# Patient Record
Sex: Female | Born: 1990 | Race: White | Hispanic: No | Marital: Single | Smoking: Current every day smoker
Health system: Southern US, Community
[De-identification: ages and names within clinical notes are randomized; demographics above are authoritative.]

## PROBLEM LIST (undated history)

## (undated) DIAGNOSIS — Z789 Other specified health status: Secondary | ICD-10-CM

## (undated) HISTORY — PX: TONSILLECTOMY: SUR1361

## (undated) HISTORY — PX: OTHER SURGICAL HISTORY: SHX169

---
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
## 2012-06-12 ENCOUNTER — Encounter (HOSPITAL_COMMUNITY): Payer: Self-pay | Admitting: Emergency Medicine

## 2012-06-12 ENCOUNTER — Emergency Department (HOSPITAL_COMMUNITY)
Admission: EM | Admit: 2012-06-12 | Discharge: 2012-06-12 | Disposition: A | Discharge status: Home / Self Care | Payer: Self-pay | Attending: Emergency Medicine | Admitting: Emergency Medicine

## 2012-06-12 DIAGNOSIS — R05 Cough: Secondary | ICD-10-CM | POA: Insufficient documentation

## 2012-06-12 DIAGNOSIS — R5381 Other malaise: Secondary | ICD-10-CM | POA: Insufficient documentation

## 2012-06-12 DIAGNOSIS — R6883 Chills (without fever): Secondary | ICD-10-CM | POA: Insufficient documentation

## 2012-06-12 DIAGNOSIS — J069 Acute upper respiratory infection, unspecified: Secondary | ICD-10-CM | POA: Insufficient documentation

## 2012-06-12 DIAGNOSIS — IMO0001 Reserved for inherently not codable concepts without codable children: Secondary | ICD-10-CM | POA: Insufficient documentation

## 2012-06-12 DIAGNOSIS — J3489 Other specified disorders of nose and nasal sinuses: Secondary | ICD-10-CM | POA: Insufficient documentation

## 2012-06-12 DIAGNOSIS — R059 Cough, unspecified: Secondary | ICD-10-CM | POA: Insufficient documentation

## 2012-06-12 DIAGNOSIS — F17200 Nicotine dependence, unspecified, uncomplicated: Secondary | ICD-10-CM | POA: Insufficient documentation

## 2012-06-12 DIAGNOSIS — J029 Acute pharyngitis, unspecified: Secondary | ICD-10-CM | POA: Insufficient documentation

## 2012-06-12 MED ORDER — PREDNISONE 10 MG PO TABS: 10.0000 mg | ORAL_TABLET | Freq: Every day | ORAL | Status: DC

## 2012-06-12 MED ORDER — AZITHROMYCIN 250 MG PO TABS: ORAL_TABLET | ORAL | Status: DC

## 2012-06-12 MED ORDER — GUAIFENESIN-CODEINE 100-10 MG/5ML PO SYRP: 10.0000 mL | ORAL_SOLUTION | Freq: Three times a day (TID) | ORAL | Status: DC | PRN

## 2012-06-12 NOTE — ED Notes (Signed)
Pt c/o cough/congestion/not feeling well x 3 days. Denies v/d. Nad noted.

## 2012-06-15 NOTE — ED Provider Notes (Signed)
History     CSN: [id number]  Arrival date & time 06/12/12  1428   First MD Initiated Contact with Patient 06/12/12 1532      Chief Complaint  Patient presents with  . Cough  . Nasal Congestion    (Consider location/radiation/quality/duration/timing/severity/associated sxs/prior treatment) HPI Comments: Shelby Ferguson is a 22 y.o. female who presents to the Emergency Department complaining of nasal congestion, cough and generalized malaise for 3 days.  Cough is occasionally productive of yellow sputum.  He denies neck pain or stiffness, fever, vomiting, chest pain or shortness of breath.  States his girlfriend has similar symptoms.    Patient is a 22 y.o. female presenting with cough. The history is provided by the patient.  Cough Cough characteristics: occassionally productive. Severity:  Mild Onset quality:  Gradual Duration:  3 days Timing:  Intermittent Progression:  Unchanged Chronicity:  New Smoker: yes   Context: upper respiratory infection   Relieved by:  Nothing Worsened by:  Deep breathing Ineffective treatments: benadryl and mucinex. Associated symptoms: chills, myalgias, rhinorrhea and sore throat   Associated symptoms: no chest pain, no ear fullness, no ear pain, no fever, no headaches, no rash, no shortness of breath, no sinus congestion and no wheezing     History reviewed. No pertinent past medical history.  History reviewed. No pertinent past surgical history.  No family history on file.  History  Substance Use Topics  . Smoking status: Current Every Day Smoker -- 0.50 packs/day    Types: Cigarettes  . Smokeless tobacco: Not on file  . Alcohol Use: No    OB History   Grav Para Term Preterm Abortions TAB SAB Ect Mult Living                  Review of Systems  Constitutional: Positive for chills. Negative for fever, activity change and appetite change.  HENT: Positive for congestion, sore throat and rhinorrhea. Negative for ear pain, facial  swelling, trouble swallowing, neck pain and neck stiffness.   Eyes: Negative for visual disturbance.  Respiratory: Positive for cough. Negative for chest tightness, shortness of breath, wheezing and stridor.   Cardiovascular: Negative for chest pain.  Gastrointestinal: Negative for nausea, vomiting and abdominal pain.  Musculoskeletal: Positive for myalgias.  Skin: Negative.  Negative for rash.  Neurological: Negative for dizziness, weakness, numbness and headaches.  Hematological: Negative for adenopathy.  Psychiatric/Behavioral: Negative for confusion.  All other systems reviewed and are negative.    Allergies  Review of patient's allergies indicates no known allergies.  Home Medications   Current Outpatient Rx  Name  Route  Sig  Dispense  Refill  . diphenhydrAMINE (BENADRYL) 25 mg capsule   Oral   Take 25 mg by mouth every 6 (six) hours as needed for allergies.         Marland Kitchen guaiFENesin (MUCINEX) 600 MG 12 hr tablet   Oral   Take 1,200 mg by mouth 2 (two) times daily.         Marland Kitchen azithromycin (ZITHROMAX Z-PAK) 250 MG tablet      Take two tablets on day one, then one tab qd days 2-5   6 tablet   0   . guaiFENesin-codeine (ROBITUSSIN AC) 100-10 MG/5ML syrup   Oral   Take 10 mLs by mouth 3 (three) times daily as needed for cough.   120 mL   0   . predniSONE (DELTASONE) 10 MG tablet   Oral   Take 1 tablet (10 mg total) by  mouth daily.   21 tablet   0     BP 116/81  Pulse 103  Temp(Src) 97.9 F (36.6 C)  Resp 20  Ht 5\' 8"  (1.727 m)  Wt 190 lb (86.183 kg)  BMI 28.9 kg/m2  SpO2 97%  LMP 05/22/2012  Physical Exam  Nursing note and vitals reviewed. Constitutional: She is oriented to person, place, and time. She appears well-developed and well-nourished. No distress.  HENT:  Head: Normocephalic and atraumatic. No trismus in the jaw.  Right Ear: Tympanic membrane and ear canal normal.  Left Ear: Tympanic membrane and ear canal normal.  Nose: Mucosal edema and  rhinorrhea present.  Mouth/Throat: Uvula is midline and mucous membranes are normal. No edematous. Posterior oropharyngeal erythema present. No oropharyngeal exudate, posterior oropharyngeal edema or tonsillar abscesses.  Eyes: Conjunctivae are normal. Pupils are equal, round, and reactive to light. Right eye exhibits no discharge. Left eye exhibits no discharge.  Neck: Normal range of motion and phonation normal. Neck supple. No Brudzinski's sign and no Kernig's sign noted.  Cardiovascular: Normal rate, regular rhythm, normal heart sounds and intact distal pulses.   No murmur heard. Pulmonary/Chest: Effort normal and breath sounds normal. No respiratory distress. She has no wheezes. She has no rales.  Coarse lung sounds bilaterally, no rales or wheezing.   Musculoskeletal: She exhibits no edema.  Lymphadenopathy:    She has no cervical adenopathy.  Neurological: She is alert and oriented to person, place, and time. She exhibits normal muscle tone. Coordination normal.  Skin: Skin is warm and dry.    ED Course  Procedures (including critical care time)  Labs Reviewed - No data to display No results found.   1. Cough   2. URI (upper respiratory infection)       MDM     VSS.  Patient is non-toxic appearing.  Girlfriend is here as patient with similar symptoms. PERC neg Appears stable for discharge.    Agrees to rest, fluids, and close f/u with his PMD or return here if needed    Faizon Capozzi L. Trisha Mangle, PA-C 06/15/12 1422

## 2012-06-17 NOTE — ED Provider Notes (Signed)
Medical screening examination/treatment/procedure(s) were performed by non-physician practitioner and as supervising physician I was immediately available for consultation/collaboration.  Raeford Razor, MD 06/17/12 1535

## 2013-11-24 ENCOUNTER — Encounter (HOSPITAL_COMMUNITY): Payer: Self-pay

## 2013-11-24 ENCOUNTER — Emergency Department (HOSPITAL_COMMUNITY)
Admission: EM | Admit: 2013-11-24 | Discharge: 2013-11-24 | Disposition: A | Discharge status: Home / Self Care | Payer: Self-pay | Attending: Emergency Medicine | Admitting: Emergency Medicine

## 2013-11-24 ENCOUNTER — Emergency Department (HOSPITAL_COMMUNITY): Payer: Self-pay

## 2013-11-24 DIAGNOSIS — J209 Acute bronchitis, unspecified: Secondary | ICD-10-CM

## 2013-11-24 DIAGNOSIS — R059 Cough, unspecified: Secondary | ICD-10-CM

## 2013-11-24 DIAGNOSIS — G479 Sleep disorder, unspecified: Secondary | ICD-10-CM | POA: Insufficient documentation

## 2013-11-24 DIAGNOSIS — R05 Cough: Secondary | ICD-10-CM

## 2013-11-24 DIAGNOSIS — Z720 Tobacco use: Secondary | ICD-10-CM | POA: Insufficient documentation

## 2013-11-24 MED ORDER — HYDROCODONE-ACETAMINOPHEN 5-325 MG PO TABS: 1.0000 | ORAL_TABLET | ORAL | Status: DC | PRN

## 2013-11-24 MED ORDER — ALBUTEROL SULFATE HFA 108 (90 BASE) MCG/ACT IN AERS
2.0000 | INHALATION_SPRAY | Freq: Once | RESPIRATORY_TRACT | Status: AC
Start: 2013-11-24 — End: 2013-11-24
  Administered 2013-11-24: 2 via RESPIRATORY_TRACT
  Filled 2013-11-24: qty 6.7

## 2013-11-24 MED ORDER — IPRATROPIUM-ALBUTEROL 0.5-2.5 (3) MG/3ML IN SOLN
3.0000 mL | Freq: Once | RESPIRATORY_TRACT | Status: AC
  Administered 2013-11-24: 3 mL via RESPIRATORY_TRACT
  Filled 2013-11-24: qty 3

## 2013-11-24 MED ORDER — HYDROCODONE-ACETAMINOPHEN 5-325 MG PO TABS
1.0000 | ORAL_TABLET | Freq: Once | ORAL | Status: AC
  Administered 2013-11-24: 1 via ORAL
  Filled 2013-11-24: qty 1

## 2013-11-24 MED ORDER — AMOXICILLIN 500 MG PO CAPS: 500.0000 mg | ORAL_CAPSULE | Freq: Three times a day (TID) | ORAL | Status: DC

## 2013-11-24 MED ORDER — BENZONATATE 100 MG PO CAPS
200.0000 mg | ORAL_CAPSULE | Freq: Once | ORAL | Status: AC
  Administered 2013-11-24: 200 mg via ORAL
  Filled 2013-11-24: qty 2

## 2013-11-24 MED ORDER — ALBUTEROL SULFATE (2.5 MG/3ML) 0.083% IN NEBU
2.5000 mg | INHALATION_SOLUTION | Freq: Once | RESPIRATORY_TRACT | Status: AC
  Administered 2013-11-24: 2.5 mg via RESPIRATORY_TRACT
  Filled 2013-11-24: qty 3

## 2013-11-24 MED ORDER — ALBUTEROL SULFATE (2.5 MG/3ML) 0.083% IN NEBU
5.0000 mg | INHALATION_SOLUTION | Freq: Once | RESPIRATORY_TRACT | Status: AC
  Administered 2013-11-24: 5 mg via RESPIRATORY_TRACT
  Filled 2013-11-24: qty 6

## 2013-11-24 MED ORDER — BENZONATATE 100 MG PO CAPS: 200.0000 mg | ORAL_CAPSULE | Freq: Three times a day (TID) | ORAL | Status: DC

## 2013-11-24 MED ORDER — PREDNISONE 50 MG PO TABS
60.0000 mg | ORAL_TABLET | Freq: Once | ORAL | Status: AC
  Administered 2013-11-24: 60 mg via ORAL
  Filled 2013-11-24 (×2): qty 1

## 2013-11-24 MED ORDER — PREDNISONE 10 MG PO TABS: ORAL_TABLET | ORAL | Status: DC

## 2013-11-24 NOTE — ED Notes (Signed)
Frequent coughing,

## 2013-11-24 NOTE — ED Provider Notes (Signed)
CSN: [id number]     Arrival date & time 11/24/13  1536 History  This chart was scribed for non-physician practitioner Burgess AmorJulie Yuritzy Zehring, PA-C working with Vanetta MuldersScott Zackowski, MD by Littie Deedsichard Sun, ED Scribe. This patient was seen in room [facility]24[facility] and the patient's care was started at 4:42 PM.      Chief Complaint  Patient presents with  . URI    The history is provided by the patient. No language interpreter was used.   HPI Comments: Shelby Ferguson is a 23 y.o. female who presents to the Emergency Department complaining of gradual onset URI symptoms that began 4-5 days ago. Patient reports having associated productive cough of yellow sputum, difficulty sleeping, rhinorrhea with clear discharge, stopped up ears, wheezing, sore throat and SOB worsened with activity. She has tried alka-seltzer and vitamin C without much relief. She denies fever. 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 Patient is a 0.5 ppd smoker; she has been smoking less since her symptoms started because smoking exacerbates her symptoms. Patient states she normally wheezes when she is sick; she has not used an inhaler in 3 years. Her LNMP was today. She denies hx of asthma.  [facility]; she does not have insurance  History reviewed. No pertinent past medical history. Past Surgical History  Procedure Laterality Date  . Tonsillectomy     No family history on file. History  Substance Use Topics  . Smoking status: Current Every Day Smoker -- 0.50 packs/day    Types: Cigarettes  . Smokeless tobacco: Not on file  . Alcohol Use: No   OB History    No data available     Review of Systems  Constitutional: Negative for fever.  HENT: Positive for congestion, rhinorrhea and sore throat. Negative for sinus pressure, trouble swallowing and voice change.   Eyes: Negative for discharge.  Respiratory: Positive for cough, shortness of breath and wheezing.   Cardiovascular:  Negative for chest pain.  Gastrointestinal: Negative for abdominal pain.  Genitourinary: Negative.   Psychiatric/Behavioral: Positive for sleep disturbance.      Allergies  Review of patient's allergies indicates no known allergies.  Home Medications   Prior to Admission medications   Medication Sig Start Date End Date Taking? Authorizing Provider  amoxicillin (AMOXIL) 500 MG capsule Take 1 capsule (500 mg total) by mouth 3 (three) times daily. 11/24/13   Burgess AmorJulie Amro Winebarger, PA-C  azithromycin (ZITHROMAX Z-PAK) 250 MG tablet Take two tablets on day one, then one tab qd days 2-5 Patient not taking: Reported on 11/24/2013 06/12/12   Tammy L. Triplett, PA-C  benzonatate (TESSALON) 100 MG capsule Take 2 capsules (200 mg total) by mouth every 8 (eight) hours. 11/24/13   Burgess AmorJulie Yutaka Holberg, PA-C  guaiFENesin-codeine (ROBITUSSIN AC) 100-10 MG/5ML syrup Take 10 mLs by mouth 3 (three) times daily as needed for cough. Patient not taking: Reported on 11/24/2013 06/12/12   Tammy L. Triplett, PA-C  HYDROcodone-acetaminophen (NORCO/VICODIN) 5-325 MG per tablet Take 1 tablet by mouth every 4 (four) hours as needed (for cough suppression). 11/24/13   Burgess AmorJulie Anikah Hogge, PA-C  predniSONE (DELTASONE) 10 MG tablet 6, 5, 4, 3, 2 then 1 tablet by mouth daily for 6 days total. 11/24/13   Burgess AmorJulie Martie Fulgham, PA-C   BP 126/79 mmHg  Pulse 81  Temp(Src) 99 F (37.2 C) (Oral)  Resp 18  Ht 5\' 8"  (1.727 m)  Wt 200 lb (90.719 kg)  BMI 30.42 kg/m2  SpO2 96%  LMP 11/17/2013 Physical Exam  Constitutional: She is oriented to person, place, and time. She appears well-developed and well-nourished.  HENT:  Head: Normocephalic and atraumatic.  Right Ear: Tympanic membrane and ear canal normal.  Left Ear: Tympanic membrane and ear canal normal.  Nose: Mucosal edema and rhinorrhea present.  Mouth/Throat: Uvula is midline, oropharynx is clear and moist and mucous membranes are normal. No oropharyngeal exudate, posterior oropharyngeal edema,  posterior oropharyngeal erythema or tonsillar abscesses.  Eyes: Conjunctivae are normal.  Cardiovascular: Normal rate and normal heart sounds.   Pulmonary/Chest: Effort normal. No respiratory distress. She has wheezes. She has no rales.  Intense inspiratory wheeze bilaterally, worse at bases. Milder wheezing with expiration.  Abdominal: Soft. There is no tenderness.  Musculoskeletal: Normal range of motion.  Neurological: She is alert and oriented to person, place, and time.  Skin: Skin is warm and dry. No rash noted.  Psychiatric: She has a normal mood and affect.  Nursing note and vitals reviewed.   ED Course  Procedures  DIAGNOSTIC STUDIES: Oxygen Saturation is 96% on room air, adequate by my interpretation.    COORDINATION OF CARE: 1:44 PM-Discussed treatment plan which includes prednisone, breathing treatment and CXR with pt at bedside and pt agreed to plan.   Labs Review Labs Reviewed - No data to display  Imaging Review Dg Chest 2 View  11/24/2013   CLINICAL DATA:  Cough and sore throat.  EXAM: CHEST  2 VIEW  COMPARISON:  None.  FINDINGS: The heart size and mediastinal contours are within normal limits. Both lungs are clear. The visualized skeletal structures are unremarkable.  IMPRESSION: No active cardiopulmonary disease.   Electronically Signed   By: Maisie Fushomas  Register   On: 11/24/2013 17:03     EKG Interpretation None      Medications  ipratropium-albuterol (DUONEB) 0.5-2.5 (3) MG/3ML nebulizer solution 3 mL (3 mLs Nebulization Given 11/24/13 1706)  albuterol (PROVENTIL) (2.5 MG/3ML) 0.083% nebulizer solution 2.5 mg (2.5 mg Nebulization Given 11/24/13 1706)  predniSONE (DELTASONE) tablet 60 mg (60 mg Oral Given 11/24/13 1655)  albuterol (PROVENTIL) (2.5 MG/3ML) 0.083% nebulizer solution 5 mg (5 mg Nebulization Given 11/24/13 1809)  benzonatate (TESSALON) capsule 200 mg (200 mg Oral Given 11/24/13 1907)  HYDROcodone-acetaminophen (NORCO/VICODIN) 5-325 MG per tablet 1  tablet (1 tablet Oral Given 11/24/13 1907)  albuterol (PROVENTIL HFA;VENTOLIN HFA) 108 (90 BASE) MCG/ACT inhaler 2 puff (2 puffs Inhalation Given 11/24/13 1924)    MDM   Final diagnoses:  Cough  Bronchitis, acute, with bronchospasm    Pt was given albuterol/atrovent neb along with prednisone PO.  She continued to have significant wheezing after this tx, although reports her breathing feels easier.  Repeated albuterol neb x 1.  Wheezing still present but better in left, resolved right lung fields.  She had persistent on productive cough after the second neb so was given tessalon and hydrocodone - coughing became better controlled with this combo of meds.  She was given an albuterol mdi for home use.  Prescribed hydrocodone, prednisone taper, tessalon, amoxil for tx of acute bronchitis.  Discussed smoking cessation.  Prn f/u anticipated, pt advised to return here for any worsened sx.  I personally performed the services described in this documentation, which was scribed in my presence. The recorded information has been reviewed and is accurate.   Burgess AmorJulie Sheril Hammond, PA-C 11/25/13 1349  Vanetta MuldersScott Zackowski, MD 12/01/13 1949

## 2013-11-24 NOTE — ED Notes (Signed)
Cough, sore throat, nasal congestion  For 4 days, No fever.  Yellow phlegm

## 2013-11-24 NOTE — ED Notes (Signed)
Cold symptoms x 3 days

## 2013-11-24 NOTE — Discharge Instructions (Signed)
Acute Bronchitis Bronchitis is inflammation of the airways that extend from the windpipe into the lungs (bronchi). The inflammation often causes mucus to develop. This leads to a cough, which is the most common symptom of bronchitis.  In acute bronchitis, the condition usually develops suddenly and goes away over time, usually in a couple weeks. Smoking, allergies, and asthma can make bronchitis worse. Repeated episodes of bronchitis may cause further lung problems.  CAUSES Acute bronchitis is most often caused by the same virus that causes a cold. The virus can spread from person to person (contagious) through coughing, sneezing, and touching contaminated objects. SIGNS AND SYMPTOMS   Cough.   Fever.   Coughing up mucus.   Body aches.   Chest congestion.   Chills.   Shortness of breath.   Sore throat.  DIAGNOSIS  Acute bronchitis is usually diagnosed through a physical exam. Your health care provider will also ask you questions about your medical history. Tests, such as chest X-rays, are sometimes done to rule out other conditions.  TREATMENT  Acute bronchitis usually goes away in a couple weeks. Oftentimes, no medical treatment is necessary. Medicines are sometimes given for relief of fever or cough. Antibiotic medicines are usually not needed but may be prescribed in certain situations. In some cases, an inhaler may be recommended to help reduce shortness of breath and control the cough. A cool mist vaporizer may also be used to help thin bronchial secretions and make it easier to clear the chest.  HOME CARE INSTRUCTIONS  Get plenty of rest.   Drink enough fluids to keep your urine clear or pale yellow (unless you have a medical condition that requires fluid restriction). Increasing fluids may help thin your respiratory secretions (sputum) and reduce chest congestion, and it will prevent dehydration.   Take medicines only as directed by your health care provider.  If  you were prescribed an antibiotic medicine, finish it all even if you start to feel better.  Avoid smoking and secondhand smoke. Exposure to cigarette smoke or irritating chemicals will make bronchitis worse. If you are a smoker, consider using nicotine gum or skin patches to help control withdrawal symptoms. Quitting smoking will help your lungs heal faster.   Reduce the chances of another bout of acute bronchitis by washing your hands frequently, avoiding people with cold symptoms, and trying not to touch your hands to your mouth, nose, or eyes.   Keep all follow-up visits as directed by your health care provider.  SEEK MEDICAL CARE IF: Your symptoms do not improve after 1 week of treatment.  SEEK IMMEDIATE MEDICAL CARE IF:  You develop an increased fever or chills.   You have chest pain.   You have severe shortness of breath.  You have bloody sputum.   You develop dehydration.  You faint or repeatedly feel like you are going to pass out.  You develop repeated vomiting.  You develop a severe headache. MAKE SURE YOU:   Understand these instructions.  Will watch your condition.  Will get help right away if you are not doing well or get worse. Document Released: 01/27/2004 Document Revised: 05/05/2013 Document Reviewed: 06/11/2012 [facility] Patient Information 2015 [place] [place]. This information is not intended to replace advice given to you by your health care provider. Make sure you discuss any questions you have with your health care provider.   Complete your entire course of antibiotics.  Use the hydrocodone for cough suppression. This will make you drowsy - do not drive  within 4 hours of taking this medicine.  Use 2 puffs of the inhaler every 4 hours if you are coughing or wheezing.  Return here if you have any worsening symptoms.

## 2016-02-11 ENCOUNTER — Emergency Department (HOSPITAL_COMMUNITY): Payer: Self-pay

## 2016-02-11 ENCOUNTER — Encounter (HOSPITAL_COMMUNITY): Payer: Self-pay | Admitting: Emergency Medicine

## 2016-02-11 ENCOUNTER — Emergency Department (HOSPITAL_COMMUNITY)
Admission: EM | Admit: 2016-02-11 | Discharge: 2016-02-11 | Disposition: A | Discharge status: Home / Self Care | Payer: Self-pay | Attending: Emergency Medicine | Admitting: Emergency Medicine

## 2016-02-11 DIAGNOSIS — R0981 Nasal congestion: Secondary | ICD-10-CM | POA: Insufficient documentation

## 2016-02-11 DIAGNOSIS — R55 Syncope and collapse: Secondary | ICD-10-CM | POA: Insufficient documentation

## 2016-02-11 DIAGNOSIS — F17210 Nicotine dependence, cigarettes, uncomplicated: Secondary | ICD-10-CM | POA: Insufficient documentation

## 2016-02-11 DIAGNOSIS — R5383 Other fatigue: Secondary | ICD-10-CM | POA: Insufficient documentation

## 2016-02-11 DIAGNOSIS — M791 Myalgia: Secondary | ICD-10-CM | POA: Insufficient documentation

## 2016-02-11 DIAGNOSIS — R05 Cough: Secondary | ICD-10-CM | POA: Insufficient documentation

## 2016-02-11 DIAGNOSIS — J111 Influenza due to unidentified influenza virus with other respiratory manifestations: Secondary | ICD-10-CM

## 2016-02-11 DIAGNOSIS — R69 Illness, unspecified: Secondary | ICD-10-CM

## 2016-02-11 DIAGNOSIS — R079 Chest pain, unspecified: Secondary | ICD-10-CM | POA: Insufficient documentation

## 2016-02-11 DIAGNOSIS — R51 Headache: Secondary | ICD-10-CM | POA: Insufficient documentation

## 2016-02-11 LAB — CBC WITH DIFFERENTIAL/PLATELET
BASOS ABS: 0 10*3/uL (ref 0.0–0.1)
Basophils Relative: 0 %
EOS ABS: 0 10*3/uL (ref 0.0–0.7)
EOS PCT: 0 %
HCT: 45.6 % (ref 36.0–46.0)
Hemoglobin: 16.1 g/dL — ABNORMAL HIGH (ref 12.0–15.0)
LYMPHS PCT: 11 %
Lymphs Abs: 1.2 10*3/uL (ref 0.7–4.0)
MCH: 31.4 pg (ref 26.0–34.0)
MCHC: 35.3 g/dL (ref 30.0–36.0)
MCV: 89.1 fL (ref 78.0–100.0)
MONO ABS: 1.6 10*3/uL — AB (ref 0.1–1.0)
Monocytes Relative: 16 %
Neutro Abs: 7.5 10*3/uL (ref 1.7–7.7)
Neutrophils Relative %: 73 %
PLATELETS: 256 10*3/uL (ref 150–400)
RBC: 5.12 MIL/uL — AB (ref 3.87–5.11)
RDW: 13.7 % (ref 11.5–15.5)
WBC: 10.4 10*3/uL (ref 4.0–10.5)

## 2016-02-11 LAB — BASIC METABOLIC PANEL
ANION GAP: 9 (ref 5–15)
BUN: 17 mg/dL (ref 6–20)
CALCIUM: 8.6 mg/dL — AB (ref 8.9–10.3)
CO2: 26 mmol/L (ref 22–32)
Chloride: 94 mmol/L — ABNORMAL LOW (ref 101–111)
Creatinine, Ser: 1.08 mg/dL — ABNORMAL HIGH (ref 0.44–1.00)
GFR calc Af Amer: >60 mL/min (ref >60)
GLUCOSE: 152 mg/dL — AB (ref 65–99)
Potassium: 4 mmol/L (ref 3.5–5.1)
SODIUM: 129 mmol/L — AB (ref 135–145)

## 2016-02-11 LAB — I-STAT BETA HCG BLOOD, ED (MC, WL, AP ONLY)

## 2016-02-11 MED ORDER — SODIUM CHLORIDE 0.9 % IV SOLN: INTRAVENOUS | Status: DC

## 2016-02-11 MED ORDER — DM-GUAIFENESIN ER 30-600 MG PO TB12: 1.0000 | ORAL_TABLET | Freq: Two times a day (BID) | ORAL | 1 refills | Status: DC

## 2016-02-11 MED ORDER — SODIUM CHLORIDE 0.9 % IV BOLUS (SEPSIS)
2000.0000 mL | Freq: Once | INTRAVENOUS | Status: AC
  Administered 2016-02-11: 2000 mL via INTRAVENOUS

## 2016-02-11 MED ORDER — OSELTAMIVIR PHOSPHATE 75 MG PO CAPS: 75.0000 mg | ORAL_CAPSULE | Freq: Two times a day (BID) | ORAL | 0 refills | Status: DC

## 2016-02-11 MED ORDER — NAPROXEN 500 MG PO TABS: 500.0000 mg | ORAL_TABLET | Freq: Two times a day (BID) | ORAL | 0 refills | Status: DC

## 2016-02-11 NOTE — ED Triage Notes (Signed)
Flu like sx for the last 2 days- no flu shot this year- no PCP

## 2016-02-11 NOTE — ED Provider Notes (Signed)
[facility]-EMER[facility]ENCY DEPT Provider Note   CSN: [id number] Arrival date & time: 02/11/16  1622     History   Chief Complaint Chief Complaint  Patient presents with  . Influenza    sx x 2 days  . Loss of Consciousness    passed out in shower this morning per mother    HPI Shelby Ferguson is a 26 y.o. female.  Patient symptoms are consistent with flulike illness. Patient with cough occasionally productive bodyaches fatigue headache and had several near syncopal or syncopal episodes today. Symptoms started yesterday. No nausea vomiting or diarrhea.      History reviewed. No pertinent past medical history.  There are no active problems to display for this patient.   Past Surgical History:  Procedure Laterality Date  . TONSILLECTOMY      OB History    No data available       Home Medications    Prior to Admission medications   Medication Sig Start Date End Date Taking? Authorizing Provider  acetaminophen (TYLENOL) 500 MG tablet Take 500 mg by mouth every 6 (six) hours as needed for mild pain or moderate pain.   Yes Historical Provider, MD  dextromethorphan-guaiFENesin (MUCINEX DM) 30-600 MG 12hr tablet Take 1 tablet by mouth 2 (two) times daily. 02/11/16   Vanetta MuldersScott Tenika Keeran, MD  naproxen (NAPROSYN) 500 MG tablet Take 1 tablet (500 mg total) by mouth 2 (two) times daily. 02/11/16   Vanetta MuldersScott Bodhi Moradi, MD  oseltamivir (TAMIFLU) 75 MG capsule Take 1 capsule (75 mg total) by mouth every 12 (twelve) hours. 02/11/16   Vanetta MuldersScott Tristyn Pharris, MD    Family History No family history on file.  Social History Social History  Substance Use Topics  . Smoking status: Current Every Day Smoker    Packs/day: 0.50    Types: Cigarettes  . Smokeless tobacco: Former NeurosurgeonUser  . Alcohol use No     Allergies   Patient has no known allergies.   Review of Systems Review of Systems  Constitutional: Positive for fatigue and fever.  HENT: Positive for congestion.   Eyes: Negative for redness.    Respiratory: Positive for cough.   Cardiovascular: Positive for chest pain.  Gastrointestinal: Negative for abdominal pain, nausea and vomiting.  Genitourinary: Negative for dysuria.  Musculoskeletal: Positive for myalgias.  Skin: Negative for rash.  Neurological: Positive for syncope and headaches.  Hematological: Does not bruise/bleed easily.  Psychiatric/Behavioral: Negative for confusion.     Physical Exam Updated Vital Signs BP 104/64 (BP Location: Left Arm)   Pulse 82   Temp 98.1 F (36.7 C) (Oral)   Resp 21   Ht 5\' 7"  (1.702 m)   Wt 79.4 kg   SpO2 95%   BMI 27.41 kg/m   Physical Exam  Constitutional: She appears well-developed and well-nourished. No distress.  HENT:  Head: Normocephalic and atraumatic.  Mucous membranes dry.  Eyes: Conjunctivae and EOM are normal. Pupils are equal, round, and reactive to light.  Neck: Neck supple.  Cardiovascular: Normal rate, regular rhythm and normal heart sounds.   Pulmonary/Chest: Effort normal and breath sounds normal. No respiratory distress.  Abdominal: Soft. Bowel sounds are normal. There is no tenderness.  Musculoskeletal: Normal range of motion.  Neurological: She is alert. No cranial nerve deficit or sensory deficit. She exhibits normal muscle tone. Coordination normal.  Skin: Skin is warm.  Nursing note and vitals reviewed.    ED Treatments / Results  Labs (all labs ordered are listed, but only abnormal results are displayed)  Labs Reviewed  CBC WITH DIFFERENTIAL/PLATELET - Abnormal; Notable for the following:       Result Value   RBC 5.12 (*)    Hemoglobin 16.1 (*)    Monocytes Absolute 1.6 (*)    All other components within normal limits  BASIC METABOLIC PANEL - Abnormal; Notable for the following:    Sodium 129 (*)    Chloride 94 (*)    Glucose, Bld 152 (*)    Creatinine, Ser 1.08 (*)    Calcium 8.6 (*)    All other components within normal limits  I-STAT BETA HCG BLOOD, ED ([facility], [facility], [facility] ONLY)     EKG  EKG Interpretation  Date/Time:  Friday February 11 2016 17:59:32 EST Ventricular Rate:  86 PR Interval:    QRS Duration: 88 QT Interval:  351 QTC Calculation: 420 R Axis:   87 Text Interpretation:  Sinus rhythm Right atrial enlargement No previous ECGs available Confirmed by Dayln Tugwell  MD, Becka Lagasse [phone]) on 02/11/2016 6:11:02 PM       Radiology Dg Chest 2 View  Result Date: 02/11/2016 CLINICAL DATA:  Cough, shortness of breath, generalized chest pain, and fever for 2 days. EXAM: CHEST  2 VIEW COMPARISON:  10/08/2014 FINDINGS: The cardiomediastinal silhouette is within normal limits. The lungs are well inflated with similar to at most minimally more prominent appearance of mild peribronchial thickening. No confluent airspace opacity, edema, pleural effusion, or pneumothorax is identified. No acute osseous abnormality is identified. IMPRESSION: Bronchitic changes.  No consolidation suggestive of pneumonia. Electronically Signed   By: Sebastian Ache M.D.   On: 02/11/2016 18:26    Procedures Procedures (including critical care time)  Medications Ordered in ED Medications  0.9 %  sodium chloride infusion (not administered)  sodium chloride 0.9 % bolus 2,000 mL (2,000 mLs Intravenous New Bag/Given 02/11/16 1758)     Initial Impression / Assessment and Plan / ED Course  I have reviewed the triage vital signs and the nursing notes.  Pertinent labs & imaging results that were available during my care of the patient were reviewed by me and considered in my medical decision making (see chart for details).     Patient with improvement with IV hydration. Patient's symptoms consistent with flulike illness. Chest x-ray negative for pneumonia. Labs without significant abnormalities other than sodium slightly low. Patient will be treated symptomatically with Naprosyn and Mucinex DM. Patient is also in the window where Tamiflu can be effective. Prescription provided work note provided. Patient  nontoxic no acute distress.  Syncopal episodes seem to be secondary to vasovagal and probably some dehydration.  Final Clinical Impressions(s) / ED Diagnoses   Final diagnoses:  Influenza-like illness  Vasovagal syncope    New Prescriptions New Prescriptions   DEXTROMETHORPHAN-GUAIFENESIN (MUCINEX DM) 30-600 MG 12HR TABLET    Take 1 tablet by mouth 2 (two) times daily.   NAPROXEN (NAPROSYN) 500 MG TABLET    Take 1 tablet (500 mg total) by mouth 2 (two) times daily.   OSELTAMIVIR (TAMIFLU) 75 MG CAPSULE    Take 1 capsule (75 mg total) by mouth every 12 (twelve) hours.     Vanetta Mulders, MD 02/11/16 2032

## 2016-02-11 NOTE — Discharge Instructions (Signed)
Symptoms consistent with flulike illness. Take the Naprosyn for the bodyaches. Take the Mucinex DM for cough and phlegm. Prescription provided for Tamiflu you are in the window where can be helpful. Return for new or worse symptoms. Work note provided.

## 2018-11-11 ENCOUNTER — Ambulatory Visit: Payer: Self-pay | Admitting: Physician Assistant

## 2020-10-14 ENCOUNTER — Other Ambulatory Visit: Payer: Self-pay

## 2020-10-14 ENCOUNTER — Encounter (HOSPITAL_COMMUNITY): Payer: Self-pay

## 2020-10-14 ENCOUNTER — Emergency Department (HOSPITAL_COMMUNITY): Payer: Self-pay

## 2020-10-14 ENCOUNTER — Emergency Department (HOSPITAL_COMMUNITY)
Admission: EM | Admit: 2020-10-14 | Discharge: 2020-10-14 | Disposition: A | Discharge status: Home / Self Care | Payer: Self-pay | Attending: Emergency Medicine | Admitting: Emergency Medicine

## 2020-10-14 DIAGNOSIS — F17210 Nicotine dependence, cigarettes, uncomplicated: Secondary | ICD-10-CM | POA: Insufficient documentation

## 2020-10-14 DIAGNOSIS — M79642 Pain in left hand: Secondary | ICD-10-CM

## 2020-10-14 DIAGNOSIS — S6992XA Unspecified injury of left wrist, hand and finger(s), initial encounter: Secondary | ICD-10-CM | POA: Insufficient documentation

## 2020-10-14 DIAGNOSIS — Y9355 Activity, bike riding: Secondary | ICD-10-CM | POA: Insufficient documentation

## 2020-10-14 DIAGNOSIS — S8992XA Unspecified injury of left lower leg, initial encounter: Secondary | ICD-10-CM | POA: Insufficient documentation

## 2020-10-14 MED ORDER — SULFAMETHOXAZOLE-TRIMETHOPRIM 800-160 MG PO TABS: 1.0000 | ORAL_TABLET | Freq: Two times a day (BID) | ORAL | 0 refills | Status: AC

## 2020-10-14 MED ORDER — SULFAMETHOXAZOLE-TRIMETHOPRIM 800-160 MG PO TABS
1.0000 | ORAL_TABLET | Freq: Once | ORAL | Status: AC
  Administered 2020-10-14: 1 via ORAL
  Filled 2020-10-14: qty 1

## 2020-10-14 MED ORDER — IBUPROFEN 800 MG PO TABS: 800.0000 mg | ORAL_TABLET | Freq: Three times a day (TID) | ORAL | 0 refills | Status: DC | PRN
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
## 2020-10-14 NOTE — Discharge Instructions (Signed)
You were seen in the emerge department today with left hand/wrist pain and swelling.  Your x-rays do not show fracture from an injury.  I am treating you with antibiotics in case this is related to your injection.  Please continue to monitor your symptoms and return if you develop fever, worsening pain/swelling, drainage.
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
## 2021-08-26 ENCOUNTER — Encounter (HOSPITAL_COMMUNITY): Payer: Self-pay

## 2021-08-26 ENCOUNTER — Other Ambulatory Visit: Payer: Self-pay

## 2021-08-26 ENCOUNTER — Emergency Department (HOSPITAL_COMMUNITY)
Admission: EM | Admit: 2021-08-26 | Discharge: 2021-08-26 | Disposition: A | Discharge status: Home / Self Care | Payer: Self-pay | Attending: Emergency Medicine | Admitting: Emergency Medicine

## 2021-08-26 DIAGNOSIS — L03114 Cellulitis of left upper limb: Secondary | ICD-10-CM | POA: Insufficient documentation

## 2021-08-26 MED ORDER — CLINDAMYCIN HCL 300 MG PO CAPS: 300.0000 mg | ORAL_CAPSULE | Freq: Three times a day (TID) | ORAL | 0 refills | Status: DC

## 2021-08-26 MED ORDER — CEFAZOLIN SODIUM 1 G IJ SOLR
1.0000 g | Freq: Once | INTRAMUSCULAR | Status: AC
  Administered 2021-08-26: 1 g via INTRAMUSCULAR
  Filled 2021-08-26: qty 10

## 2021-08-26 MED ORDER — STERILE WATER FOR INJECTION IJ SOLN
INTRAMUSCULAR | Status: AC
  Administered 2021-08-26: 10 mL
  Filled 2021-08-26: qty 10

## 2021-08-26 NOTE — Discharge Instructions (Addendum)
You were seen today for skin infection.  First dose of antibiotics given today, take the clindamycin 3 times daily for 7 days.  If the redness spreads outside of the border we do need to return back to ED.  Also if you have fever, chest pain, short of breath, worsening swelling or pain when you move your elbow return back to ED.

## 2021-08-26 NOTE — ED Provider Notes (Signed)
[facility] EMERGENCY DEPARTMENT Provider Note   CSN: [id number] Arrival date & time: 08/26/21  1404     History  Chief Complaint  Patient presents with   Abscess    Left arm    Shelby Ferguson is a 31 y.o. female.   Abscess    Patient with medical history of IV drug use, substance use disorder presents today due to abscess to left forearm.  Happened acutely yesterday after injecting methamphetamine.  She is confident the needle did not break and she did not retain the needle..  She does share needles.  States it has been swelling and the redness has been spreading which worsened today.  Denies fevers, chest pain, shortness of breath, paresthesias, pain in the joint.   Home Medications Prior to Admission medications   Medication Sig Start Date End Date Taking? Authorizing Provider  clindamycin (CLEOCIN) 300 MG capsule Take 1 capsule (300 mg total) by mouth 3 (three) times daily. 08/26/21  Yes Theron Arista, PA-C  acetaminophen (TYLENOL) 500 MG tablet Take 500 mg by mouth every 6 (six) hours as needed for mild pain or moderate pain.    [provider]  dextromethorphan-guaiFENesin (MUCINEX DM) 30-600 MG 12hr tablet Take 1 tablet by mouth 2 (two) times daily. 02/11/16   Vanetta Mulders, MD  ibuprofen (ADVIL) 800 MG tablet Take 1 tablet (800 mg total) by mouth every 8 (eight) hours as needed for moderate pain. 10/14/20   Long, Arlyss Repress, MD  naproxen (NAPROSYN) 500 MG tablet Take 1 tablet (500 mg total) by mouth 2 (two) times daily. 02/11/16   Vanetta Mulders, MD  oseltamivir (TAMIFLU) 75 MG capsule Take 1 capsule (75 mg total) by mouth every 12 (twelve) hours. 02/11/16   Vanetta Mulders, MD      Allergies    Patient has no known allergies.    Review of Systems   Review of Systems  Physical Exam Updated Vital Signs BP (!) 126/101 (BP Location: Right Arm)   Pulse (!) 111   Temp 97.8 F (36.6 C) (Oral)   Resp 20   Ht 5\' 8"  (1.727 m)   Wt 99.8 kg   LMP 08/02/2021    SpO2 100%   BMI 33.45 kg/m  Physical Exam Vitals and nursing note reviewed. Exam conducted with a chaperone present.  Constitutional:      General: She is not in acute distress.    Appearance: Normal appearance.  HENT:     Head: Normocephalic and atraumatic.  Eyes:     General: No scleral icterus.       Right eye: No discharge.        Left eye: No discharge.     Extraocular Movements: Extraocular movements intact.     Pupils: Pupils are equal, round, and reactive to light.  Cardiovascular:     Rate and Rhythm: Regular rhythm. Tachycardia present.     Pulses: Normal pulses.     Heart sounds: Normal heart sounds. No murmur heard.    No friction rub. No gallop.  Pulmonary:     Effort: Pulmonary effort is normal. No respiratory distress.     Breath sounds: Normal breath sounds.  Abdominal:     General: Abdomen is flat. Bowel sounds are normal. There is no distension.     Palpations: Abdomen is soft.     Tenderness: There is no abdominal tenderness.  Musculoskeletal:        General: No tenderness.     Comments: Full ROM to elbow without  difficulty, no point tenderness over the olecranon process.  Skin:    General: Skin is warm and dry.     Capillary Refill: Capillary refill takes less than 2 seconds.     Coloration: Skin is not jaundiced.     Findings: Erythema present.     Comments: Track marks to left upper extremity.  Surrounding erythema with some swelling Induration but no palpable foreign body or obvious area of fluctuance.    Neurological:     Mental Status: She is alert. Mental status is at baseline.     Coordination: Coordination normal.        ED Results / Procedures / Treatments   Labs (all labs ordered are listed, but only abnormal results are displayed) Labs Reviewed - No data to display  EKG None  Radiology No results found.  Procedures Procedures    Medications Ordered in ED Medications  ceFAZolin (ANCEF) injection 1 g (has no administration  in time range)  sterile water (preservative free) injection (has no administration in time range)    ED Course/ Medical Decision Making/ A&P                           Medical Decision Making Risk Prescription drug management.   Patient is in status to left extremity abscess.  Differential includes not limited to abscess, cellulitis, retained foreign body, septic emboli, sepsis.  On exam patient is neurovascular intact. Afebrile, mildly tachycardic but patient does not not appear septic. Suspect tachycardia secondary to recent methamphetamine usage.  Full ROM, no point tenderness over the elbow.  Not consistent with a septic joint.  Track marks to UE and LE limb.  Erythema and swelling to the anterior forearm as seen in picture.  Not amenable to I&D.    Initially I considered laboratory work-up and cultures, infection appears to be well localized and superficial.  Does not meet septic criteria, additionally patient verbalized she is not interested in staying in the hospital.  She denies any chest pain short of breath, not hypoxic and I do not think this is consistent with septic emboli.  Engaged in shared decision-making with patient, we will try outpatient trial of clindamycin with strict return precautions. Purple outline drawn over erythematous area on arm.   We will give 1 IM dose of Ancef here in the ED and discharge patient with clindamycin.    Discussed HPI, physical exam and plan of care for this patient with attending Vanetta Mulders. The attending physician evaluated this patient as part of a shared visit and agrees with plan of care.         Final Clinical Impression(s) / ED Diagnoses Final diagnoses:  Cellulitis of left upper extremity    Rx / DC Orders ED Discharge Orders          Ordered    clindamycin (CLEOCIN) 300 MG capsule  3 times daily        08/26/21 1828              Theron Arista, PA-C 08/26/21 2122    Vanetta Mulders, MD 08/27/21 [phone]

## 2021-08-26 NOTE — ED Triage Notes (Signed)
Pt to ED via triage c/o abscess to left forearm that occurred yesterday. Reports was shooting up meth to left forearm and that's when this occurred. Swelling and redness noted to area.

## 2022-06-16 IMAGING — DX DG HAND COMPLETE 3+V*L*
3 series · 3 of 3 positions shown · non-contrast
Comparison: None.

CLINICAL DATA: Hand swelling and pain after a fall 2 days ago.

EXAM:
LEFT HAND - COMPLETE 3+ VIEW

[hand pa]
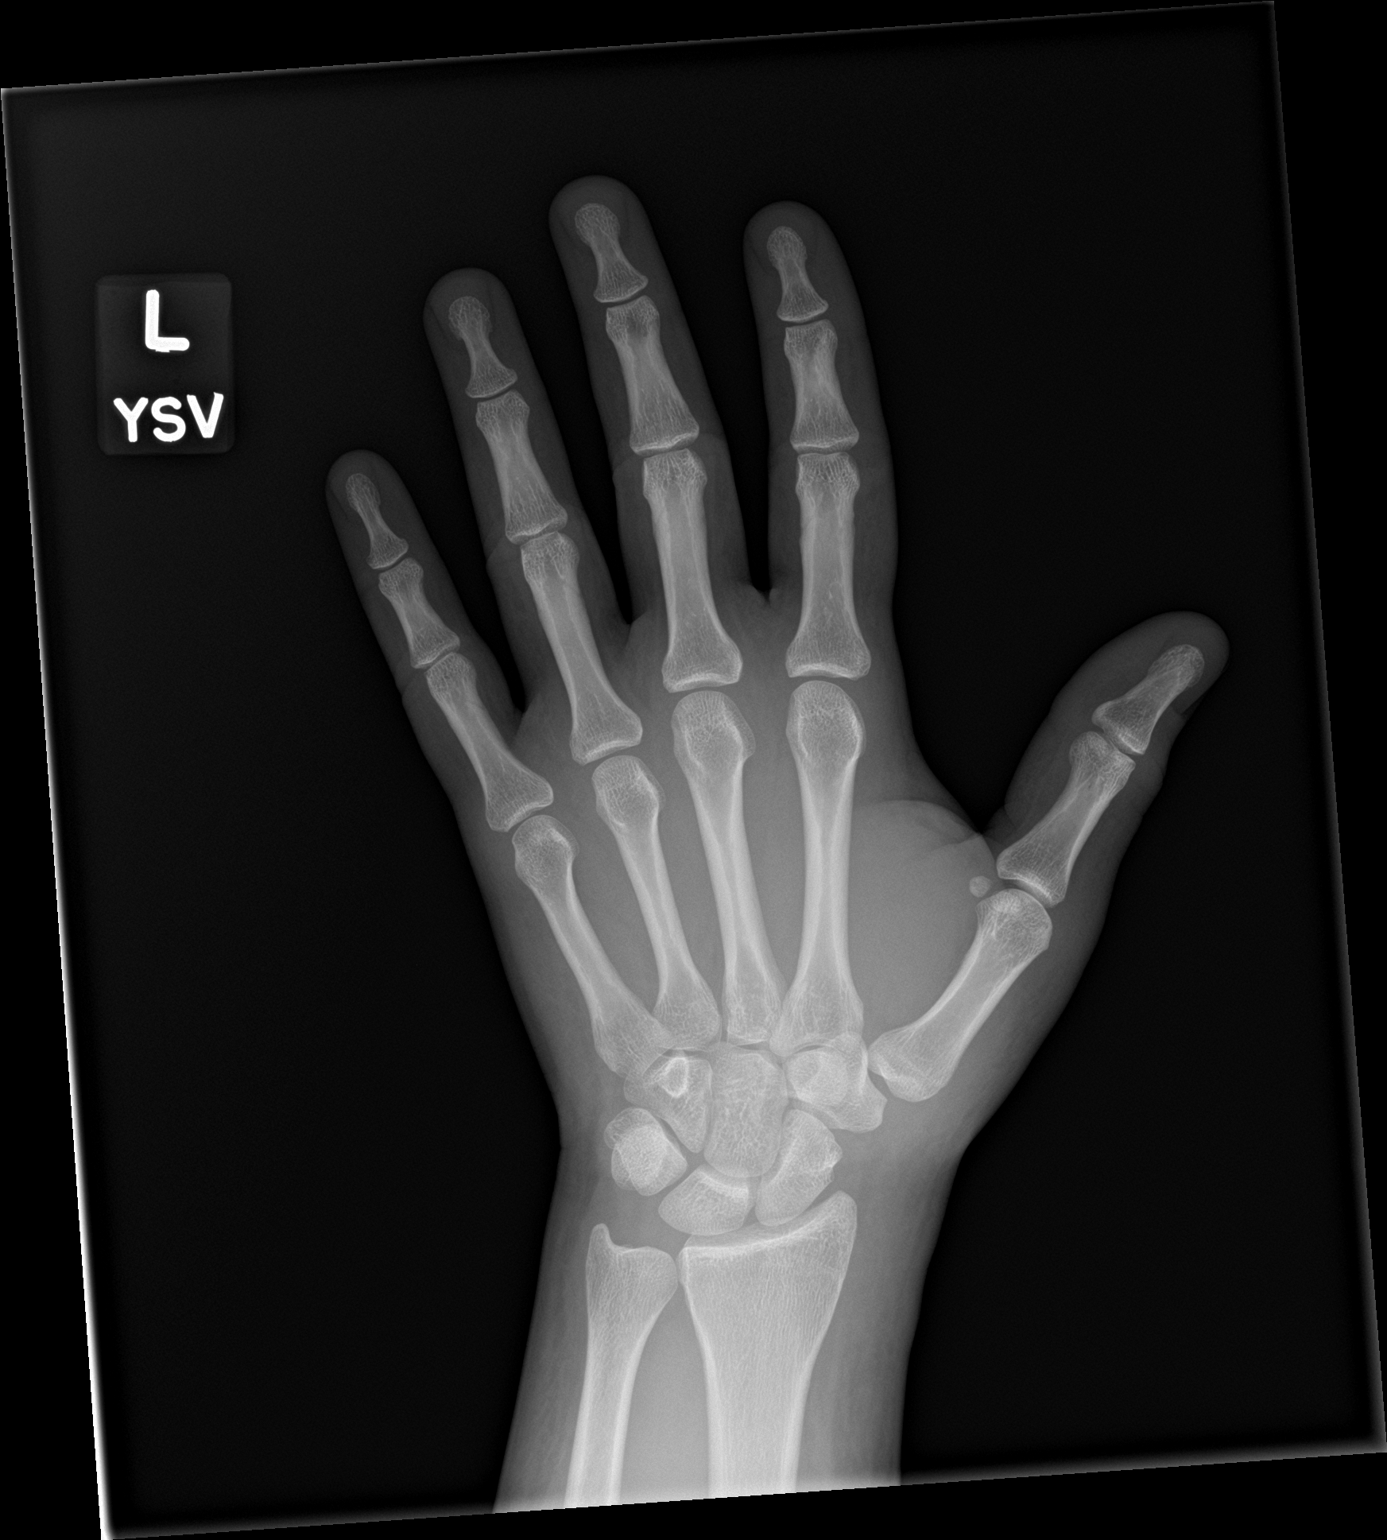

[hand obl]
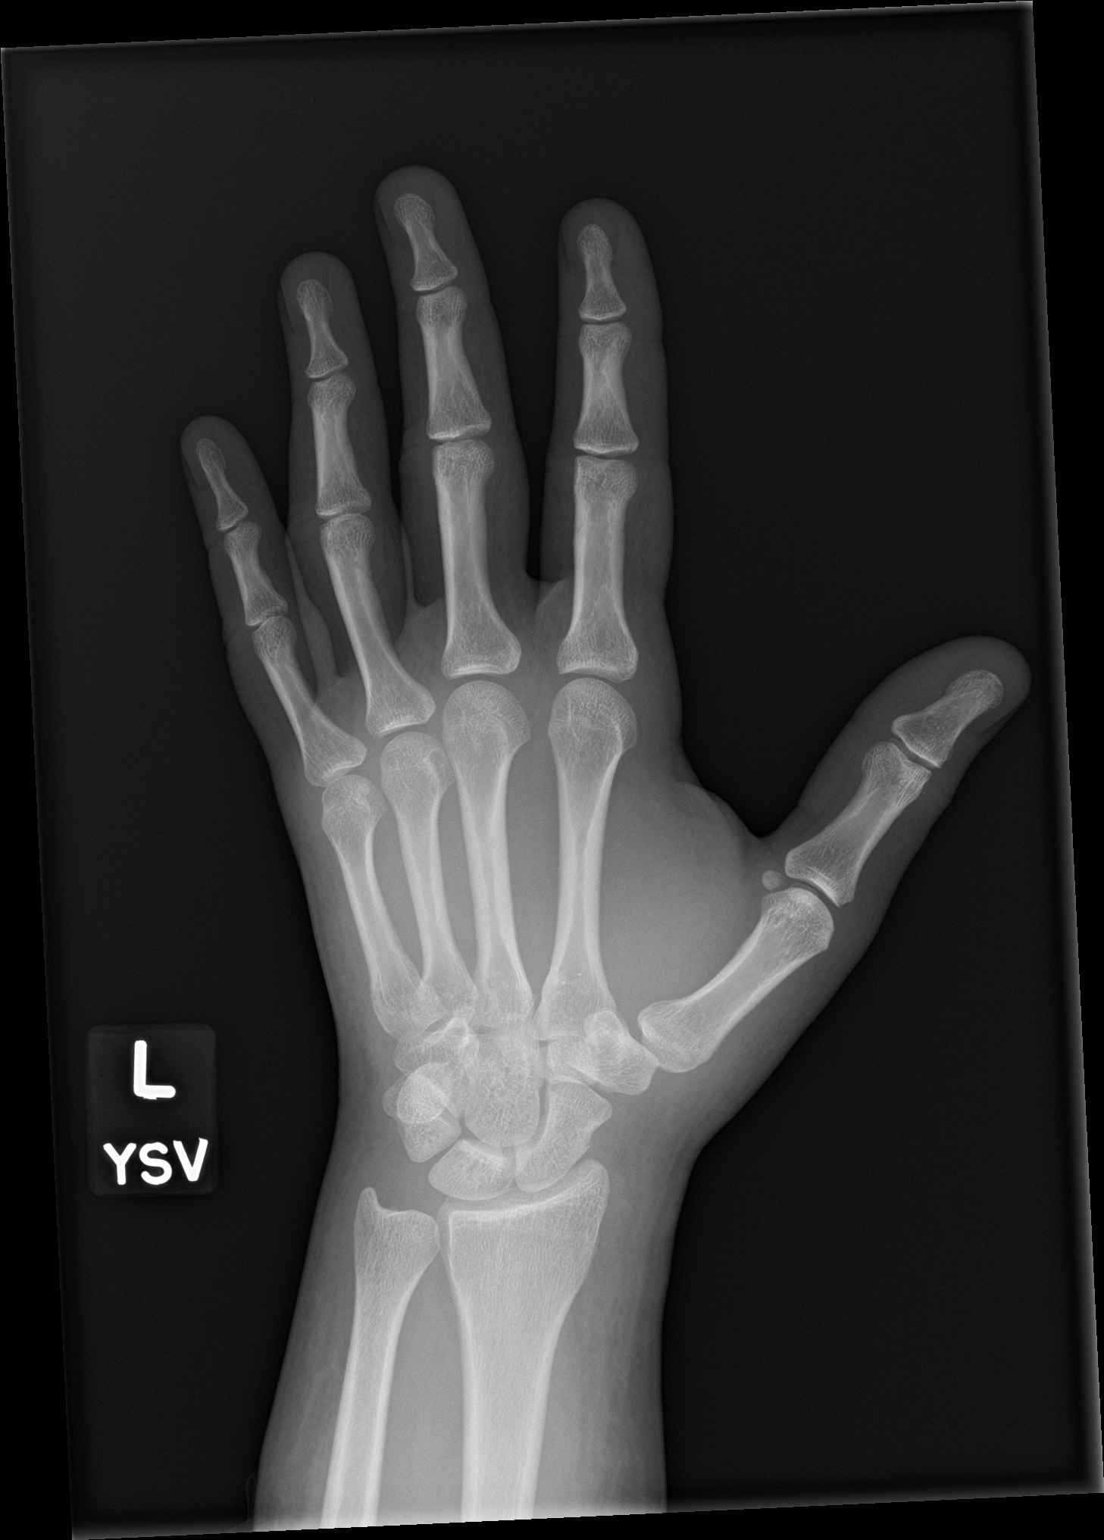

[hand lat]
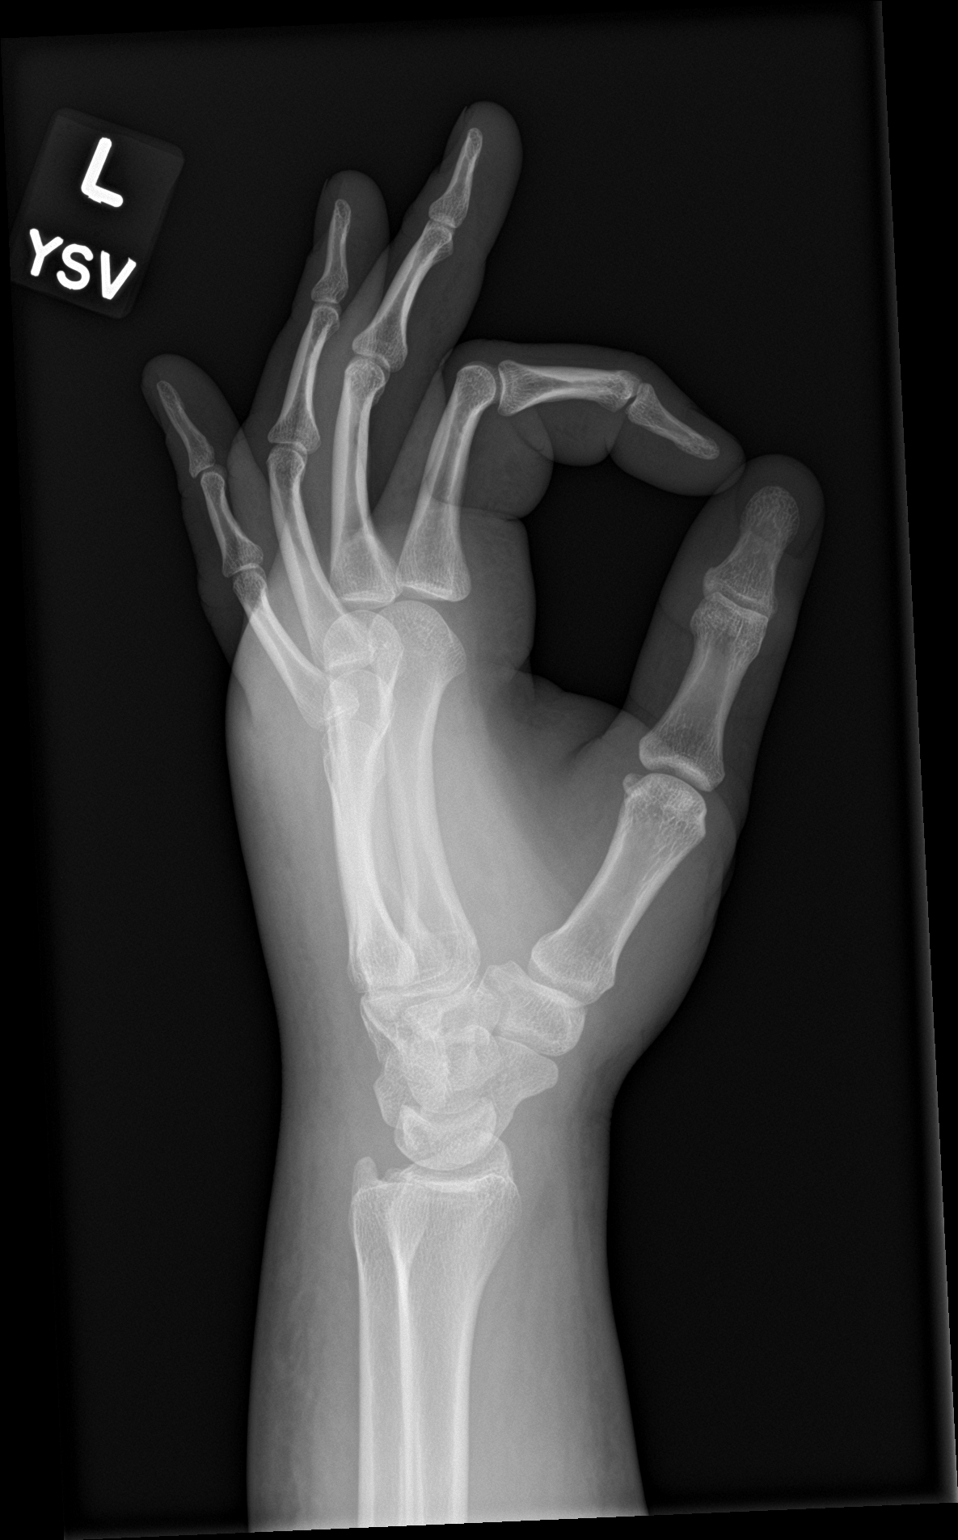

[3 of 3 positions shown; findings below may reference images not displayed]

FINDINGS: Diffuse soft tissue swelling of the distal forearm and dorsum of the
hand. No evidence of acute fracture or dislocation. No focal bone
lesion or bone destruction. Joint spaces are normal. No radiopaque
soft tissue foreign bodies or gas collections.
IMPRESSION: Diffuse soft tissue swelling.  No acute bony abnormalities.

## 2022-06-19 ENCOUNTER — Encounter (HOSPITAL_COMMUNITY): Payer: Self-pay

## 2022-06-19 ENCOUNTER — Encounter (HOSPITAL_COMMUNITY): Payer: Self-pay | Admitting: Oral Surgery

## 2022-06-19 ENCOUNTER — Other Ambulatory Visit: Payer: Self-pay

## 2022-06-19 NOTE — Progress Notes (Signed)
Spoke with pt for pre-op call. Pt denies any past medical history.  Shower instructions given to pt.

## 2022-06-19 NOTE — Anesthesia Preprocedure Evaluation (Signed)
Anesthesia Evaluation    Reviewed: Allergy & Precautions, Patient's Chart, lab work & pertinent test results  Airway        Dental   Pulmonary Current Smoker          Cardiovascular negative cardio ROS      Neuro/Psych negative neurological ROS  negative psych ROS   GI/Hepatic negative GI ROS,,,(+)     substance abuse  methamphetamine use and IV drug use  Endo/Other  negative endocrine ROS    Renal/GU negative Renal ROS  negative genitourinary   Musculoskeletal negative musculoskeletal ROS (+)    Abdominal   Peds  Hematology negative hematology ROS (+)   Anesthesia Other Findings   Reproductive/Obstetrics                             Anesthesia Physical Anesthesia Plan  ASA: 2  Anesthesia Plan: General   Post-op Pain Management: Tylenol PO (pre-op)*   Induction: Intravenous  PONV Risk Score and Plan: 2 and Midazolam, Dexamethasone and Ondansetron  Airway Management Planned: Nasal ETT  Additional Equipment:   Intra-op Plan:   Post-operative Plan: Extubation in OR  Informed Consent: I have reviewed the patients History and Physical, chart, labs and discussed the procedure including the risks, benefits and alternatives for the proposed anesthesia with the patient or authorized representative who has indicated his/her understanding and acceptance.     Dental advisory given  Plan Discussed with: CRNA  Anesthesia Plan Comments: (Pt did not arrive for surgery.)       Anesthesia Quick Evaluation

## 2022-06-19 NOTE — H&P (Signed)
Date: June 19, 2022   Patient: Shelby Ferguson  PID: [id number]  DOB: [birth year]  SEX: Female   Patient referred by DDS for extraction all remaining teeth  CC: Painful teeth  Past Medical History:  Smoker, Drug Abuse/Alcohol Abuse, Obese    Medications: None    Allergies:     None    Surgeries:   Tonsillectomy     Social History       Smoking: 1.5 ppd           Alcohol:n Drug use: Clean 1.5 years                            Exam: BMI 31. Multiple  caries teeth # 1, 2, 3, 4, 5, 6, 7, 8, 9, 10, 11, 12, 13, 14, 16, 20, 21, 22, 23, 24, 25, 26, 27, 28, 29, 31, 32.   No purulence, edema, fluctuance, trismus. Oral cancer screening negative. Pharynx clear. No lymphadenopathy.  Panorex: Decay teeth # 1, 2, 3, 4, 5, 6, 7, 8, 9, 10, 11, 12, 13, 14, 16, 20, 21, 22, 23, 24, 25, 26, 27, 28, 29, 31, 32.   Assessment: ASA 2. Non-restorable   teeth # 1, 2, 3, 4, 5, 6, 7, 8, 9, 10, 11, 12, 13, 14, 16, 20, 21, 22, 23, 24, 25, 26, 27, 28, 29, 31, 32.               Plan: Extraction Teeth #  1, 2, 3, 4, 5, 6, 7, 8, 9, 10, 11, 12, 13, 14, 16, 20, 21, 22, 23, 24, 25, 26, 27, 28, 29, 31, 32. Alveoloplasty.  Hospital Day surgery.                 Rx: n               Risks and complications explained. Questions answered.   Georgia Lopes, DMD

## 2022-06-20 ENCOUNTER — Ambulatory Visit (HOSPITAL_COMMUNITY): Admission: RE | Admit: 2022-06-20 | Payer: Medicaid Other | Source: Home / Self Care | Admitting: Oral Surgery

## 2022-06-20 HISTORY — DX: Other specified health status: Z78.9

## 2022-06-20 SURGERY — DENTAL RESTORATION/EXTRACTIONS
Anesthesia: General

## 2022-08-11 ENCOUNTER — Other Ambulatory Visit: Payer: Self-pay

## 2022-08-11 ENCOUNTER — Encounter (HOSPITAL_COMMUNITY): Payer: Self-pay

## 2022-08-11 ENCOUNTER — Emergency Department (HOSPITAL_COMMUNITY)
Admission: EM | Admit: 2022-08-11 | Discharge: 2022-08-11 | Payer: MEDICAID | Attending: Emergency Medicine | Admitting: Emergency Medicine

## 2022-08-11 DIAGNOSIS — Z01419 Encounter for gynecological examination (general) (routine) without abnormal findings: Secondary | ICD-10-CM | POA: Diagnosis not present

## 2022-08-11 NOTE — Discharge Instructions (Signed)
Your visit today was based on the concern for [facility] body in [facility], your exam doesn't show any foreign bodies. You are cleared from an emergency standpoint to proceed with your legal actions. Please return for any changes or new concerns.

## 2022-08-11 NOTE — ED Triage Notes (Signed)
Pt was taken to [facility], when doing her intake the scan showed "questionable" objects.  Pt denies having anything in her vagina or rectum.

## 2022-08-11 NOTE — ED Notes (Signed)
Pelvic set up in room Pt undressed from waist down.

## 2022-08-12 NOTE — ED Provider Notes (Signed)
  [place] EMERGENCY DEPARTMENT AT [facility] Provider Note   CSN: [id number] Arrival date & time: 08/11/22  2100     History Chief Complaint  Patient presents with   Foreign Body    Shelby Ferguson is a 32 y.o. female.  32 year old female that was [facility] when they did an x-ray and thought she had foreign body in her vagina.  I did an AP and a lateral view apparently.  They thought it might be just gas or feces however would not accept her until it was made sure that this was not a foreign body.  No other complaints.  She would not be here otherwise.  She does consent to pelvic exam even after I told her that I cannot force her to do it she just wants to get it done so that she can get back and get processed and go home..   Foreign Body      Home Medications Prior to Admission medications   Not on File      Allergies    Patient has no known allergies.    Review of Systems   Review of Systems  Physical Exam Updated Vital Signs BP (!) 137/113 (BP Location: Right Arm)   Pulse 95   Temp 98.5 F (36.9 C) (Oral)   Resp 18   Ht 5\' 7"  (1.702 m)   Wt 90.7 kg   LMP 08/04/2022   SpO2 100%   BMI 31.32 kg/m  Physical Exam Vitals and nursing note reviewed.  Constitutional:      Appearance: She is well-developed.  HENT:     Head: Normocephalic and atraumatic.  Cardiovascular:     Rate and Rhythm: Normal rate and regular rhythm.  Pulmonary:     Effort: No respiratory distress.     Breath sounds: No stridor.  Abdominal:     General: There is no distension.  Genitourinary:    Comments: Chaperoned by nurse tech - Amil Amen, no foreign body or other injury noted. Musculoskeletal:     Cervical back: Normal range of motion.  Neurological:     Mental Status: She is alert.     ED Results / Procedures / Treatments   Labs (all labs ordered are listed, but only abnormal results are displayed) Labs Reviewed - No data to  display  EKG None  Radiology No results found.  Procedures Procedures    Medications Ordered in ED Medications - No data to display  ED Course/ Medical Decision Making/ A&P                                 Medical Decision Making  No vaginal foreign body.  Patient does not want any other testing or workup done and this is [facility] to [facility] the jail personnel.   Final Clinical Impression(s) / ED Diagnoses Final diagnoses:  Normal pelvic exam    Rx / DC Orders ED Discharge Orders     None         Maude Hettich, Barbara Cower, MD 08/12/22 [phone]

## 2022-11-17 ENCOUNTER — Other Ambulatory Visit: Payer: Self-pay

## 2022-11-17 ENCOUNTER — Inpatient Hospital Stay (HOSPITAL_COMMUNITY)
Admission: EM | Admit: 2022-11-17 | Discharge: 2022-11-19 | DRG: 872 | Discharge status: Against Medical Advice | Payer: MEDICAID | Attending: Internal Medicine | Admitting: Internal Medicine

## 2022-11-17 ENCOUNTER — Encounter (HOSPITAL_COMMUNITY): Payer: Self-pay

## 2022-11-17 ENCOUNTER — Emergency Department (HOSPITAL_COMMUNITY): Payer: MEDICAID

## 2022-11-17 DIAGNOSIS — Z1152 Encounter for screening for COVID-19: Secondary | ICD-10-CM

## 2022-11-17 DIAGNOSIS — Z716 Tobacco abuse counseling: Secondary | ICD-10-CM

## 2022-11-17 DIAGNOSIS — L03311 Cellulitis of abdominal wall: Secondary | ICD-10-CM

## 2022-11-17 DIAGNOSIS — Z7151 Drug abuse counseling and surveillance of drug abuser: Secondary | ICD-10-CM | POA: Diagnosis not present

## 2022-11-17 DIAGNOSIS — F17200 Nicotine dependence, unspecified, uncomplicated: Secondary | ICD-10-CM | POA: Diagnosis not present

## 2022-11-17 DIAGNOSIS — Z6830 Body mass index (BMI) 30.0-30.9, adult: Secondary | ICD-10-CM

## 2022-11-17 DIAGNOSIS — F1910 Other psychoactive substance abuse, uncomplicated: Secondary | ICD-10-CM | POA: Diagnosis present

## 2022-11-17 DIAGNOSIS — E669 Obesity, unspecified: Secondary | ICD-10-CM | POA: Diagnosis present

## 2022-11-17 DIAGNOSIS — R6520 Severe sepsis without septic shock: Secondary | ICD-10-CM

## 2022-11-17 DIAGNOSIS — E876 Hypokalemia: Secondary | ICD-10-CM | POA: Diagnosis present

## 2022-11-17 DIAGNOSIS — F17210 Nicotine dependence, cigarettes, uncomplicated: Secondary | ICD-10-CM | POA: Diagnosis present

## 2022-11-17 DIAGNOSIS — L02214 Cutaneous abscess of groin: Secondary | ICD-10-CM | POA: Diagnosis present

## 2022-11-17 DIAGNOSIS — A419 Sepsis, unspecified organism: Secondary | ICD-10-CM | POA: Diagnosis present

## 2022-11-17 DIAGNOSIS — L03314 Cellulitis of groin: Secondary | ICD-10-CM | POA: Diagnosis present

## 2022-11-17 LAB — RESP PANEL BY RT-PCR (RSV, FLU A&B, COVID)  RVPGX2
Influenza A by PCR: NEGATIVE
Influenza B by PCR: NEGATIVE
Resp Syncytial Virus by PCR: NEGATIVE
SARS Coronavirus 2 by RT PCR: NEGATIVE

## 2022-11-17 LAB — APTT: aPTT: 22 s — ABNORMAL LOW (ref 24–36)

## 2022-11-17 LAB — URINALYSIS, W/ REFLEX TO CULTURE (INFECTION SUSPECTED)
Bacteria, UA: NONE SEEN
Glucose, UA: NEGATIVE mg/dL
Hgb urine dipstick: NEGATIVE
Ketones, ur: NEGATIVE mg/dL
Leukocytes,Ua: NEGATIVE
Nitrite: NEGATIVE
Protein, ur: 100 mg/dL — AB
Specific Gravity, Urine: >1.046 — ABNORMAL HIGH (ref 1.005–1.030)
pH: 5 (ref 5.0–8.0)

## 2022-11-17 LAB — COMPREHENSIVE METABOLIC PANEL WITH GFR
ALT: 24 U/L (ref 0–44)
AST: 20 U/L (ref 15–41)
Albumin: 3.1 g/dL — ABNORMAL LOW (ref 3.5–5.0)
Alkaline Phosphatase: 84 U/L (ref 38–126)
Anion gap: 12 (ref 5–15)
BUN: 14 mg/dL (ref 6–20)
CO2: 27 mmol/L (ref 22–32)
Calcium: 8.8 mg/dL — ABNORMAL LOW (ref 8.9–10.3)
Chloride: 98 mmol/L (ref 98–111)
Creatinine, Ser: 0.71 mg/dL (ref 0.44–1.00)
GFR, Estimated: >60 mL/min
Glucose, Bld: 121 mg/dL — ABNORMAL HIGH (ref 70–99)
Potassium: 3.1 mmol/L — ABNORMAL LOW (ref 3.5–5.1)
Sodium: 137 mmol/L (ref 135–145)
Total Bilirubin: 0.5 mg/dL
Total Protein: 7.6 g/dL (ref 6.5–8.1)

## 2022-11-17 LAB — LACTIC ACID, PLASMA
Lactic Acid, Venous: 1.7 mmol/L (ref 0.5–1.9)
Lactic Acid, Venous: 2.2 mmol/L (ref 0.5–1.9)
Lactic Acid, Venous: 0.8 mmol/L (ref 0.5–1.9)

## 2022-11-17 LAB — CBC WITH DIFFERENTIAL/PLATELET
Abs Immature Granulocytes: 0.07 10*3/uL (ref 0.00–0.07)
Basophils Absolute: 0.1 10*3/uL (ref 0.0–0.1)
Basophils Relative: 0 %
Eosinophils Absolute: 0 10*3/uL (ref 0.0–0.5)
Eosinophils Relative: 0 %
HCT: 37.9 % (ref 36.0–46.0)
Hemoglobin: 12.7 g/dL (ref 12.0–15.0)
Immature Granulocytes: 1 %
Lymphocytes Relative: 15 %
Lymphs Abs: 2.3 10*3/uL (ref 0.7–4.0)
MCH: 28.6 pg (ref 26.0–34.0)
MCHC: 33.5 g/dL (ref 30.0–36.0)
MCV: 85.4 fL (ref 80.0–100.0)
Monocytes Absolute: 0.6 10*3/uL (ref 0.1–1.0)
Monocytes Relative: 4 %
Neutro Abs: 12.1 10*3/uL — ABNORMAL HIGH (ref 1.7–7.7)
Neutrophils Relative %: 80 %
Platelets: 407 10*3/uL — ABNORMAL HIGH (ref 150–400)
RBC: 4.44 MIL/uL (ref 3.87–5.11)
RDW: 13.1 % (ref 11.5–15.5)
WBC: 15.1 10*3/uL — ABNORMAL HIGH (ref 4.0–10.5)
nRBC: 0 % (ref 0.0–0.2)

## 2022-11-17 LAB — PREGNANCY, URINE: Preg Test, Ur: NEGATIVE

## 2022-11-17 LAB — PROTIME-INR
INR: 1.2 (ref 0.8–1.2)
Prothrombin Time: 15.2 s (ref 11.4–15.2)

## 2022-11-17 MED ORDER — OXYCODONE-ACETAMINOPHEN 5-325 MG PO TABS
2.0000 | ORAL_TABLET | Freq: Once | ORAL | Status: AC
  Administered 2022-11-17: 2 via ORAL
  Filled 2022-11-17: qty 2

## 2022-11-17 MED ORDER — ONDANSETRON HCL 4 MG/2ML IJ SOLN
4.0000 mg | Freq: Once | INTRAMUSCULAR | Status: AC
  Administered 2022-11-17: 4 mg via INTRAVENOUS
  Filled 2022-11-17: qty 2

## 2022-11-17 MED ORDER — SODIUM CHLORIDE 0.9 % IV SOLN
2.0000 g | Freq: Once | INTRAVENOUS | Status: AC
  Administered 2022-11-17: 2 g via INTRAVENOUS
  Filled 2022-11-17: qty 20

## 2022-11-17 MED ORDER — KETOROLAC TROMETHAMINE 30 MG/ML IJ SOLN
30.0000 mg | Freq: Once | INTRAMUSCULAR | Status: AC
  Administered 2022-11-17: 30 mg via INTRAVENOUS
  Filled 2022-11-17: qty 1

## 2022-11-17 MED ORDER — SODIUM CHLORIDE 0.9% FLUSH
10.0000 mL | Freq: Two times a day (BID) | INTRAVENOUS | Status: DC
  Administered 2022-11-17 – 2022-11-19 (×4): 10 mL via INTRAVENOUS

## 2022-11-17 MED ORDER — IOHEXOL 300 MG/ML  SOLN
100.0000 mL | Freq: Once | INTRAMUSCULAR | Status: AC | PRN
  Administered 2022-11-17: 100 mL via INTRAVENOUS

## 2022-11-17 MED ORDER — VANCOMYCIN HCL IN DEXTROSE 1-5 GM/200ML-% IV SOLN
1000.0000 mg | Freq: Once | INTRAVENOUS | Status: AC
  Administered 2022-11-17: 1000 mg via INTRAVENOUS
  Filled 2022-11-17: qty 200

## 2022-11-17 MED ORDER — ACETAMINOPHEN 500 MG PO TABS
1000.0000 mg | ORAL_TABLET | Freq: Once | ORAL | Status: AC
  Administered 2022-11-17: 1000 mg via ORAL
  Filled 2022-11-17: qty 2

## 2022-11-17 MED ORDER — VANCOMYCIN HCL 1500 MG/300ML IV SOLN
1500.0000 mg | Freq: Two times a day (BID) | INTRAVENOUS | Status: DC
  Administered 2022-11-18: 1500 mg via INTRAVENOUS
  Filled 2022-11-17: qty 300

## 2022-11-17 MED ORDER — SODIUM CHLORIDE 0.9 % IV BOLUS
1000.0000 mL | Freq: Once | INTRAVENOUS | Status: AC
  Administered 2022-11-17: 1000 mL via INTRAVENOUS

## 2022-11-17 NOTE — H&P (Incomplete)
History and Physical    Patient: Shelby Ferguson HYQ:[id number] DOB: Mar 13, 1990 DOA: 11/17/2022 DOS: the patient was seen and examined on 11/17/2022 PCP: Patient, No Pcp Per  Patient coming from: Home  Chief Complaint:  Chief Complaint  Patient presents with  . Abscess   HPI: Shelby Ferguson is a 32 y.o. female with medical history significant of tobacco use disorder, IV drug abuse (fentanyl) who presents to the emergency department due to pain, swelling and rash in the suprapubic region.  Patient complained of noticing a small ingrown area in the pubic area about 6 days ago, she popped it, but resulted in swelling around suprapubic region and increasing pain, so she went to an urgent care yesterday where she was given Bactrim, but this made her sick to her stomach and worsening rash that has now spread to the right hip as well as feeling of subjective fever, so she went to another urgent care at [facility] today and she was asked to go to the ED for further evaluation and management.  She denies chest pain, shortness of breath, headache, abdominal pain, diarrhea.  ED Course:  In the emergency department, patient was tachypneic with pulse of 101 bpm on arrival to the ED.  BP was 133/97 and other vital signs were within normal range.  Workup in the ED showed normal CBC except for leukocytosis with WBC of 15.1 and platelets of 407.  Lactic acid 1.7 > 2.2 > 0.8, urinalysis was unimpressive for UTI, pregnancy test was negative.  Influenza A, B, SARS coronavirus 2, RSV was negative.  Blood culture pending. CT abdomen and pelvis with contrast showed cellulitis of the anterior pelvic wall and mons pubis.  No drainable fluid collection/abscess or soft tissue gas.  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  Ultrasound may provide better evaluation of the pelvic structures.  Moderate colonic stool burden.  No bowel obstruction.  Normal appendix. Patient was treated with IV ceftriaxone and vancomycin, pain  medication with Percocet and Toradol were given.  Zofran was given and she was provided with Tylenol. Hospitalist was asked to admit patient for further evaluation and management.  Review of Systems: Review of systems as noted in the HPI. All other systems reviewed and are negative.   Past Medical History:  Diagnosis Date  . Medical history non-contributory    Past Surgical History:  Procedure Laterality Date  . dental removal    . TONSILLECTOMY      Social History:  reports that she has been smoking cigarettes. She has quit using smokeless tobacco. She reports that she does not currently use drugs. She reports that she does not drink alcohol.   No Known Allergies  History reviewed. No pertinent family history.  ***  Prior to Admission medications   Not on File    Physical Exam: BP 115/77   Pulse 72   Temp 98.4 F (36.9 C) (Oral)   Resp 14   Ht 5\' 7"  (1.702 m)   Wt 88.5 kg   LMP 11/03/2022 (Exact Date)   SpO2 100%   BMI 30.54 kg/m   General: 32 y.o. year-old female well developed well nourished in no acute distress.  Alert and oriented x3. HEENT: NCAT, EOMI Neck: Supple, trachea medial Cardiovascular: Regular rate and rhythm with no rubs or gallops.  No thyromegaly or JVD noted.  No lower extremity edema. 2/4 pulses in all 4 extremities. Respiratory: Clear to auscultation with no wheezes or rales. Good inspiratory effort. Abdomen: Soft, nontender nondistended with normal bowel sounds x4  quadrants. Muskuloskeletal: No cyanosis, clubbing or edema noted bilaterally Neuro: CN II-XII intact, strength 5/5 x 4, sensation, reflexes intact Skin: Erythematous rash over the suprapubic, mons pubis, right inguinal and right thigh noted.  No ulcerative lesions noted  Psychiatry: Judgement and insight appear normal. Mood is appropriate for condition and setting          Labs on Admission:  Basic Metabolic Panel: Recent Labs  Lab 11/17/22 1830  NA 137  K 3.1*  CL 98   CO2 27  GLUCOSE 121*  BUN 14  CREATININE 0.71  CALCIUM 8.8*   Liver Function Tests: Recent Labs  Lab 11/17/22 1830  AST 20  ALT 24  ALKPHOS 84  BILITOT 0.5  PROT 7.6  ALBUMIN 3.1*   No results for input(s): "LIPASE", "AMYLASE" in the last 168 hours. No results for input(s): "AMMONIA" in the last 168 hours. CBC: Recent Labs  Lab 11/17/22 1830  WBC 15.1*  NEUTROABS 12.1*  HGB 12.7  HCT 37.9  MCV 85.4  PLT 407*   Cardiac Enzymes: No results for input(s): "CKTOTAL", "CKMB", "CKMBINDEX", "TROPONINI" in the last 168 hours.  BNP (last 3 results) No results for input(s): "BNP" in the last 8760 hours.  ProBNP (last 3 results) No results for input(s): "PROBNP" in the last 8760 hours.  CBG: No results for input(s): "GLUCAP" in the last 168 hours.  Radiological Exams on Admission: CT ABDOMEN PELVIS W CONTRAST  Result Date: 11/17/2022 CLINICAL DATA:  Abdominal abscess in the groin area. EXAM: CT ABDOMEN AND PELVIS WITH CONTRAST TECHNIQUE: Multidetector CT imaging of the abdomen and pelvis was performed using the standard protocol following bolus administration of intravenous contrast. RADIATION DOSE REDUCTION: This exam was performed according to the departmental dose-optimization program which includes automated exposure control, adjustment of the mA and/or kV according to patient size and/or use of iterative reconstruction technique. CONTRAST:  OMNIPAQUE IOHEXOL 300 MG/ML  SOLN COMPARISON:  None Available. FINDINGS: Lower chest: The visualized lung bases are clear. No intra-abdominal free air or free fluid. Hepatobiliary: Probable mild fatty liver. No biliary dilatation. The gallbladder is unremarkable. Pancreas: Unremarkable. No pancreatic ductal dilatation or surrounding inflammatory changes. Spleen: Normal in size without focal abnormality. Adrenals/Urinary Tract: The adrenal glands unremarkable. The kidneys, visualized ureters, and urinary bladder unremarkable.  Stomach/Bowel: There is moderate stool throughout the colon. No bowel obstruction or active inflammation. The appendix is normal. Vascular/Lymphatic: The abdominal aorta and IVC are unremarkable. No portal venous gas. There is no adenopathy. Reproductive: The uterus is anteverted and grossly unremarkable. Fluid-filled tubular structure in the region of the adnexa bilaterally may represent small bowel or hydrosalpinx. But concerning for hydrosalpinx. Clinical correlation is recommended ultrasound may provide better evaluation of the pelvic structures. Other: There is thickening of the skin and inflammatory changes of the subcutaneous soft tissues of the anterior pelvic wall and mons pubis consistent with cellulitis. Somewhat lobulated fluid collections in the subcutaneous fat represent edema or phlegmonous changes. No drainable fluid collection/abscess or soft tissue gas. Musculoskeletal: No acute or significant osseous findings. IMPRESSION: 1. Cellulitis of the anterior pelvic wall and mons pubis. No drainable fluid collection/abscess or soft tissue gas. 2. 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 Ultrasound may provide better evaluation of the pelvic structures. 3. Moderate colonic stool burden. No bowel obstruction. Normal appendix. Electronically Signed   By: Elgie Collard M.D.   On: 11/17/2022 22:27   DG Chest Port 1 View  Result Date: 11/17/2022 CLINICAL DATA:  Possible sepsis EXAM: PORTABLE CHEST 1  VIEW COMPARISON:  02/11/2016 FINDINGS: The heart size and mediastinal contours are within normal limits. Both lungs are clear. The visualized skeletal structures are unremarkable. IMPRESSION: No active disease. Electronically Signed   By: Alcide Clever M.D.   On: 11/17/2022 20:58    EKG: I independently viewed the EKG done and my findings are as followed: Normal sinus rhythm at a rate of 91 bpm  Assessment/Plan Present on Admission: . Cellulitis of groin  Principal Problem:   Cellulitis of  groin  Severe sepsis secondary to cellulitis of groin Patient meets sepsis criteria due to tachycardia, leukocytosis (met SIRS criteria) and source of infection was cellulitis.  Lactic acid was 2.2, thereby met criteria for severe sepsis CT abdomen and pelvis with contrast showed cellulitis of the anterior pelvic wall and mons pubis. Patient was treated with IV ceftriaxone and vancomycin, we shall continue with IV vancomycin Continue Tylenol as needed Continue oxycodone 5 milligram every 4 hours as needed Continue to monitor patient and consider IV fluid if hypotensive  Tobacco use disorder Patient was counseled on tobacco abuse cessation  IV drug abuse Patient abuses fentanyl She was advised on drug abuse cessation   DVT prophylaxis: ***   Code Status: ***   Family Communication: ***   Disposition Plan: ***   Consults called: ***   Admission status: ***     Frankey Shown MD [facility] Pager [phone]  If 7PM-7AM, please contact night-coverage [email or web address] Password [facility]  11/17/2022, 11:24 PM     Review of Systems: {ROS_Text:26778} Past Medical History:  Diagnosis Date  . Medical history non-contributory    Past Surgical History:  Procedure Laterality Date  . dental removal    . TONSILLECTOMY     Social History:  reports that she has been smoking cigarettes. She has quit using smokeless tobacco. She reports that she does not currently use drugs. She reports that she does not drink alcohol.  No Known Allergies  History reviewed. No pertinent family history.  Prior to Admission medications   Not on File    Physical Exam: Vitals:   11/17/22 2115 11/17/22 2215 11/17/22 2245 11/17/22 2300  BP: (!) 124/94 112/71 116/80 115/77  Pulse: 95 84 78 72  Resp: 19 15 14 14   Temp:      TempSrc:      SpO2: 100% 100% 98% 100%  Weight:      Height:       *** Data Reviewed: {Tip this will not be part of the note when signed- Document your  independent interpretation of telemetry tracing, EKG, lab, Radiology test or any other diagnostic tests. Add any new diagnostic test ordered today. (Optional):26781} {Results:26384}  Assessment and Plan: No notes have been filed under this hospital service. Service: Hospitalist     Advance Care Planning:   Code Status: Not on file ***  Consults: ***  Family Communication: ***  Severity of Illness: {Observation/Inpatient:21159}  Author: Frankey Shown, DO 11/17/2022 11:05 PM  For on call review [email or web address].

## 2022-11-17 NOTE — Progress Notes (Signed)
Pharmacy Antibiotic Note  Shelby Ferguson is a 32 y.o. female admitted on 11/17/2022 with cellulitis/sepsis.  Pharmacy has been consulted for vancomycin dosing.  -hx IVDU  Plan: Vancomycin 1000 mg x 1 ordered in ED. Will add vancomycin 1000 mg x 1 for a total of 2000 mg as Loading dose. Will continue with Vancomycin 1500 mg IV Q 12 hrs. Goal AUC 400-550. Expected AUC: 538 SCr used: 0.8  (actual 0.71) Cmin 14.5  Continue to assess renal fxn, cultures,etc   Height: 5\' 7"  (170.2 cm) Weight: 88.5 kg (195 lb) IBW/kg (Calculated) : 61.6  Temp (24hrs), Avg:98.4 F (36.9 C), Min:98.4 F (36.9 C), Max:98.4 F (36.9 C)  Recent Labs  Lab 11/17/22 1830  WBC 15.1*  CREATININE 0.71  LATICACIDVEN 1.7    Estimated Creatinine Clearance: 117.5 mL/min (by C-G formula based on SCr of 0.71 mg/dL).    No Known Allergies  Antimicrobials this admission: Ceftriaxone x 1  11/15  vancomycin 11/15 >>    Dose adjustments this admission:    Microbiology results: 11/15 BCx: pending   UCx:      Sputum:      MRSA PCR:    Thank you for allowing pharmacy to be a part of this patient's care.  Bari Mantis PharmD Clinical Pharmacist 11/17/2022

## 2022-11-17 NOTE — ED Triage Notes (Signed)
Pt c/o upper groin nodule that started off like a pimple or in grown hair and she popped it and now states it keeps growing.

## 2022-11-17 NOTE — H&P (Signed)
History and Physical    Patient: Shelby Ferguson [id number]:[id number] DOB: March 30, 1990 DOA: 11/17/2022 DOS: the patient was seen and examined on 11/18/2022 PCP: Patient, No Pcp Per  Patient coming from: Home  Chief Complaint:  Chief Complaint  Patient presents with   Abscess   HPI: Shelby Ferguson is a 32 y.o. female with medical history significant of tobacco use disorder, IV drug abuse (fentanyl) who presents to the emergency department due to pain, swelling and rash in the suprapubic region.  Patient complained of noticing a small ingrown area in the pubic area about 6 days ago, she popped it, but resulted in swelling around suprapubic region and increasing pain, so she went to an urgent care yesterday where she was given Bactrim, but this made her sick to her stomach and worsening rash that has now spread to the right hip as well as feeling of subjective fever, so she went to another urgent care at [facility] today and she was asked to go to the ED for further evaluation and management.  She denies chest pain, shortness of breath, headache, abdominal pain, diarrhea.  ED Course:  In the emergency department, patient was tachypneic with pulse of 101 bpm on arrival to the ED.  BP was 133/97 and other vital signs were within normal range.  Workup in the ED showed normal CBC except for leukocytosis with WBC of 15.1 and platelets of 407.  Lactic acid 1.7 > 2.2 > 0.8, urinalysis was unimpressive for UTI, pregnancy test was negative.  Influenza A, B, SARS coronavirus 2, RSV was negative.  Blood culture pending. CT abdomen and pelvis with contrast showed cellulitis of the anterior pelvic wall and mons pubis.  No drainable fluid collection/abscess or soft tissue gas.  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  Ultrasound may provide better evaluation of the pelvic structures.  Moderate colonic stool burden.  No bowel obstruction.  Normal appendix. Patient was treated with IV ceftriaxone and vancomycin, pain  medication with Percocet and Toradol were given.  Zofran was given and she was provided with Tylenol. Hospitalist was asked to admit patient for further evaluation and management.  Review of Systems: Review of systems as noted in the HPI. All other systems reviewed and are negative.   Past Medical History:  Diagnosis Date   Medical history non-contributory    Past Surgical History:  Procedure Laterality Date   dental removal     TONSILLECTOMY      Social History:  reports that she has been smoking cigarettes. She has quit using smokeless tobacco. She reports that she does not currently use drugs. She reports that she does not drink alcohol.   No Known Allergies  History reviewed. No pertinent family history.    Prior to Admission medications   Not on File    Physical Exam: BP 114/76   Pulse 78   Temp 98.4 F (36.9 C) (Oral)   Resp 15   Ht 5\' 7"  (1.702 m)   Wt 88.5 kg   LMP 11/03/2022 (Exact Date)   SpO2 99%   BMI 30.54 kg/m   General: 32 y.o. year-old female well developed well nourished in no acute distress.  Alert and oriented x3. HEENT: NCAT, EOMI Neck: Supple, trachea medial Cardiovascular: Regular rate and rhythm with no rubs or gallops.  No thyromegaly or JVD noted.  No lower extremity edema. 2/4 pulses in all 4 extremities. Respiratory: Clear to auscultation with no wheezes or rales. Good inspiratory effort. Abdomen: Soft, nontender nondistended with normal bowel sounds x4  quadrants. Muskuloskeletal: No cyanosis, clubbing or edema noted bilaterally Neuro: CN II-XII intact, strength 5/5 x 4, sensation, reflexes intact Skin: Erythematous rash over the suprapubic, mons pubis, right inguinal and right thigh noted.  No ulcerative lesions noted  Psychiatry: Judgement and insight appear normal. Mood is appropriate for condition and setting          Labs on Admission:  Basic Metabolic Panel: Recent Labs  Lab 11/17/22 1830  NA 137  K 3.1*  CL 98  CO2 27   GLUCOSE 121*  BUN 14  CREATININE 0.71  CALCIUM 8.8*   Liver Function Tests: Recent Labs  Lab 11/17/22 1830  AST 20  ALT 24  ALKPHOS 84  BILITOT 0.5  PROT 7.6  ALBUMIN 3.1*   No results for input(s): "LIPASE", "AMYLASE" in the last 168 hours. No results for input(s): "AMMONIA" in the last 168 hours. CBC: Recent Labs  Lab 11/17/22 1830  WBC 15.1*  NEUTROABS 12.1*  HGB 12.7  HCT 37.9  MCV 85.4  PLT 407*   Cardiac Enzymes: No results for input(s): "CKTOTAL", "CKMB", "CKMBINDEX", "TROPONINI" in the last 168 hours.  BNP (last 3 results) No results for input(s): "BNP" in the last 8760 hours.  ProBNP (last 3 results) No results for input(s): "PROBNP" in the last 8760 hours.  CBG: No results for input(s): "GLUCAP" in the last 168 hours.  Radiological Exams on Admission: CT ABDOMEN PELVIS W CONTRAST  Result Date: 11/17/2022 CLINICAL DATA:  Abdominal abscess in the groin area. EXAM: CT ABDOMEN AND PELVIS WITH CONTRAST TECHNIQUE: Multidetector CT imaging of the abdomen and pelvis was performed using the standard protocol following bolus administration of intravenous contrast. RADIATION DOSE REDUCTION: This exam was performed according to the departmental dose-optimization program which includes automated exposure control, adjustment of the mA and/or kV according to patient size and/or use of iterative reconstruction technique. CONTRAST:  OMNIPAQUE IOHEXOL 300 MG/ML  SOLN COMPARISON:  None Available. FINDINGS: Lower chest: The visualized lung bases are clear. No intra-abdominal free air or free fluid. Hepatobiliary: Probable mild fatty liver. No biliary dilatation. The gallbladder is unremarkable. Pancreas: Unremarkable. No pancreatic ductal dilatation or surrounding inflammatory changes. Spleen: Normal in size without focal abnormality. Adrenals/Urinary Tract: The adrenal glands unremarkable. The kidneys, visualized ureters, and urinary bladder unremarkable. Stomach/Bowel:  There is moderate stool throughout the colon. No bowel obstruction or active inflammation. The appendix is normal. Vascular/Lymphatic: The abdominal aorta and IVC are unremarkable. No portal venous gas. There is no adenopathy. Reproductive: The uterus is anteverted and grossly unremarkable. Fluid-filled tubular structure in the region of the adnexa bilaterally may represent small bowel or hydrosalpinx. But concerning for hydrosalpinx. Clinical correlation is recommended ultrasound may provide better evaluation of the pelvic structures. Other: There is thickening of the skin and inflammatory changes of the subcutaneous soft tissues of the anterior pelvic wall and mons pubis consistent with cellulitis. Somewhat lobulated fluid collections in the subcutaneous fat represent edema or phlegmonous changes. No drainable fluid collection/abscess or soft tissue gas. Musculoskeletal: No acute or significant osseous findings. IMPRESSION: 1. Cellulitis of the anterior pelvic wall and mons pubis. No drainable fluid collection/abscess or soft tissue gas. 2. 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 Ultrasound may provide better evaluation of the pelvic structures. 3. Moderate colonic stool burden. No bowel obstruction. Normal appendix. Electronically Signed   By: Elgie Collard M.D.   On: 11/17/2022 22:27   DG Chest Port 1 View  Result Date: 11/17/2022 CLINICAL DATA:  Possible sepsis EXAM: PORTABLE CHEST 1  VIEW COMPARISON:  02/11/2016 FINDINGS: The heart size and mediastinal contours are within normal limits. Both lungs are clear. The visualized skeletal structures are unremarkable. IMPRESSION: No active disease. Electronically Signed   By: Alcide Clever M.D.   On: 11/17/2022 20:58    EKG: I independently viewed the EKG done and my findings are as followed: Normal sinus rhythm at a rate of 91 bpm  Assessment/Plan Present on Admission:  Cellulitis of groin  Principal Problem:   Cellulitis of groin Active Problems:    Severe sepsis (HCC)   Tobacco use disorder   Drug abuse (HCC)  Severe sepsis secondary to cellulitis of groin Patient meets sepsis criteria due to tachycardia, leukocytosis (met SIRS criteria) and source of infection was cellulitis.  Lactic acid was 2.2, thereby met criteria for severe sepsis CT abdomen and pelvis with contrast showed cellulitis of the anterior pelvic wall and mons pubis. Patient was treated with IV ceftriaxone and vancomycin, we shall continue with IV vancomycin Continue Tylenol as needed Continue oxycodone 5 milligram every 4 hours as needed Continue to monitor patient and consider IV fluid if hypotensive  Tobacco use disorder Patient was counseled on tobacco abuse cessation  IV drug abuse Patient abuses fentanyl She was advised on drug abuse cessation   DVT prophylaxis: Lovenox  Code Status: Full code  Family Communication: None at bedside  Consults: None  Severity of Illness: The appropriate patient status for this patient is INPATIENT. Inpatient status is judged to be reasonable and necessary in order to provide the required intensity of service to ensure the patient's safety. The patient's presenting symptoms, physical exam findings, and initial radiographic and laboratory data in the context of their chronic comorbidities is felt to place them at high risk for further clinical deterioration. Furthermore, it is not anticipated that the patient will be medically stable for discharge from the hospital within 2 midnights of admission.   * I certify that at the point of admission it is my clinical judgment that the patient will require inpatient hospital care spanning beyond 2 midnights from the point of admission due to high intensity of service, high risk for further deterioration and high frequency of surveillance required.*  Author: Frankey Shown, DO 11/18/2022 12:09 AM  For on call review [email or web address].

## 2022-11-17 NOTE — ED Provider Notes (Signed)
[place] [facility] DEPARTMENT AT [facility] Provider Note   CSN: [id number] Arrival date & time: 11/17/22  1730     History  Chief Complaint  Patient presents with   Abscess    Shelby Ferguson is a 32 y.o. female.   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  She went to the urgent care yesterday because of some increasing pain and swelling around her suprapubic region and was given Bactrim, she felt like things were getting worse, the pain was getting worse, she went back to a different urgent care today and was told she needed to come to the emergency department.  She denies fevers or chills but states that she has a rapidly spreading redness, tenderness and a feeling of subjective fevers.  No vomiting, no diarrhea, no swelling of the legs.  The redness is now extending onto her right hip and thigh.  All of this initially started with a small ingrown hair that she tried to pick or pop about a week ago.  Home Medications Prior to Admission medications   Not on File      Allergies    Patient has no known allergies.    Review of Systems   Review of Systems  Constitutional:  Positive for fever.  Skin:  Positive for rash.  All other systems reviewed and are negative.   Physical Exam Updated Vital Signs BP (!) 133/97 (BP Location: Right Arm)   Pulse 99   Temp 98.4 F (36.9 C) (Oral)   Resp 16   Ht 1.702 m (5\' 7" )   Wt 88.5 kg   LMP 11/03/2022 (Exact Date)   SpO2 99%   BMI 30.54 kg/m  Physical Exam Vitals and nursing note reviewed.  Constitutional:      General: She is not in acute distress.    Appearance: She is well-developed.  HENT:     Head: Normocephalic and atraumatic.     Mouth/Throat:     Pharynx: No oropharyngeal exudate.  Eyes:     General: No scleral icterus.       Right eye: No discharge.         Left eye: No discharge.     Conjunctiva/sclera: Conjunctivae normal.     Pupils: Pupils are equal, round, and reactive to light.  Neck:     Thyroid: No thyromegaly.     Vascular: No JVD.  Cardiovascular:     Rate and Rhythm: Regular rhythm. Tachycardia present.     Heart sounds: Normal heart sounds. No murmur heard.    No friction rub. No gallop.  Pulmonary:     Effort: Pulmonary effort is normal. No respiratory distress.     Breath sounds: Normal breath sounds. No wheezing or rales.  Abdominal:     General: Bowel sounds are normal. There is no distension.     Palpations: Abdomen is soft. There is no mass.     Tenderness: There is no abdominal tenderness.  Musculoskeletal:        General: No tenderness. Normal range of motion.     Cervical back: Normal range of motion and neck supple.  Lymphadenopathy:     Cervical: No cervical adenopathy.  Skin:    General: Skin is warm and dry.     Findings: Erythema and rash present.     Comments: Track  marks on bilateral hands, significant and severe redness of the suprapubic area, mons pubis and extending onto the right inguinal region and thigh laterally as well as the proximal thigh  Neurological:     Mental Status: She is alert.     Coordination: Coordination normal.  Psychiatric:        Behavior: Behavior normal.     ED Results / Procedures / Treatments   Labs (all labs ordered are listed, but only abnormal results are displayed) Labs Reviewed  RESP PANEL BY RT-PCR (RSV, FLU A&B, COVID)  [id number]  CULTURE, BLOOD (ROUTINE X 2)  CULTURE, BLOOD (ROUTINE X 2)  LACTIC ACID, PLASMA  LACTIC ACID, PLASMA  COMPREHENSIVE METABOLIC PANEL  CBC WITH DIFFERENTIAL/PLATELET  PROTIME-INR  APTT  URINALYSIS, W/ REFLEX TO CULTURE (INFECTION SUSPECTED)  POC URINE PREG, ED    EKG EKG Interpretation Date/Time:  Friday November 17 2022 18:40:38 EST Ventricular Rate:  91 PR Interval:  150 QRS Duration:  97 QT Interval:  352 QTC  Calculation: 433 R Axis:   68  Text Interpretation: Sinus rhythm Confirmed by Eber Hong ([id number]) on 11/17/2022 7:03:29 PMI do well [place] at [facility] 158 is regular at 158  Radiology CT ABDOMEN PELVIS W CONTRAST  Result Date: 11/17/2022 CLINICAL DATA:  Abdominal abscess in the groin area. EXAM: CT ABDOMEN AND PELVIS WITH CONTRAST TECHNIQUE: Multidetector CT imaging of the abdomen and pelvis was performed using the standard protocol following bolus administration of intravenous contrast. RADIATION DOSE REDUCTION: This exam was performed according to the departmental dose-optimization program which includes automated exposure control, adjustment of the mA and/or kV according to patient size and/or use of iterative reconstruction technique. CONTRAST:  OMNIPAQUE IOHEXOL 300 MG/ML  SOLN COMPARISON:  None Available. FINDINGS: Lower chest: The visualized lung bases are clear. No intra-abdominal free air or free fluid. Hepatobiliary: Probable mild fatty liver. No biliary dilatation. The gallbladder is unremarkable. Pancreas: Unremarkable. No pancreatic ductal dilatation or surrounding inflammatory changes. Spleen: Normal in size without focal abnormality. Adrenals/Urinary Tract: The adrenal glands unremarkable. The kidneys, visualized ureters, and urinary bladder unremarkable. Stomach/Bowel: There is moderate stool throughout the colon. No bowel obstruction or active inflammation. The appendix is normal. Vascular/Lymphatic: The abdominal aorta and IVC are unremarkable. No portal venous gas. There is no adenopathy. Reproductive: The uterus is anteverted and grossly unremarkable. Fluid-filled tubular structure in the region of the adnexa bilaterally may represent small bowel or hydrosalpinx. But concerning for hydrosalpinx. Clinical correlation is recommended ultrasound may provide better evaluation of the pelvic structures. Other: There is thickening of the skin and inflammatory changes of the  subcutaneous soft tissues of the anterior pelvic wall and mons pubis consistent with cellulitis. Somewhat lobulated fluid collections in the subcutaneous fat represent edema or phlegmonous changes. No drainable fluid collection/abscess or soft tissue gas. Musculoskeletal: No acute or significant osseous findings. IMPRESSION: 1. Cellulitis of the anterior pelvic wall and mons pubis. No drainable fluid collection/abscess or soft tissue gas. 2. 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 Ultrasound may provide better evaluation of the pelvic structures. 3. Moderate colonic stool burden. No bowel obstruction. Normal appendix. Electronically Signed   By: Elgie Collard M.D.   On: 11/17/2022 22:27   DG Chest Port 1 View  Result Date: 11/17/2022 CLINICAL DATA:  Possible sepsis EXAM: PORTABLE CHEST 1 VIEW COMPARISON:  02/11/2016 FINDINGS: The heart size and mediastinal contours are within normal limits. Both lungs are clear. The visualized skeletal structures are unremarkable. IMPRESSION: No active disease. Electronically Signed  By: Alcide Clever M.D.   On: 11/17/2022 20:58    Procedures Procedures    Medications Ordered in ED Medications  sodium chloride flush (NS) 0.9 % injection 10 mL (has no administration in time range)  vancomycin (VANCOCIN) IVPB 1000 mg/200 mL premix (has no administration in time range)  cefTRIAXone (ROCEPHIN) 2 g in sodium chloride 0.9 % 100 mL IVPB (has no administration in time range)    ED Course/ Medical Decision Making/ A&P                                 Medical Decision Making Amount and/or Complexity of Data Reviewed Labs: ordered. Radiology: ordered. ECG/medicine tests: ordered.  Risk OTC drugs. Prescription drug management.    This patient presents to the ED for concern of cellulitis and abscess, this involves an extensive number of treatment options, and is a complaint that carries with it a high risk of complications and morbidity.  The differential  diagnosis includes cellulitis, abscess, deep tissue infection such as necrotizing fasciitis, sepsis   Co morbidities that complicate the patient evaluation  IV drug use,   Additional history obtained:  Additional history obtained from electronic medical record External records from outside source obtained and reviewed including admitted to outside hospital with a diagnosis of an opioid use disorder and withdrawal, she has had multiple visits to the ER for skin and soft tissue infections   Lab Tests:  I Ordered, and personally interpreted labs.  The pertinent results include: Elevated white blood cell count   Imaging Studies ordered:  I ordered imaging studies including CT scan of the abdomen and pelvis I independently visualized and interpreted imaging which showed significant amount of cellulitis extending from the mons pubis, suprapubic and into the right inguinal region and thigh I agree with the radiologist interpretation   Cardiac Monitoring: / EKG:  The patient was maintained on a cardiac monitor.  I personally viewed and interpreted the cardiac monitored which showed an underlying rhythm of: Borderline tachycardia   Consultations Obtained:  I requested consultation with the hospitalist Dr. Thomes Dinning,  and discussed lab and imaging findings as well as pertinent plan - they recommend: Admission to the hospital with antibiotics   Problem List / ED Course / Critical interventions / Medication management  The patient is septic from what appears to be a cellulitis, I suspect this is a staphylococcal source and the patient was given Rocephin and Zithromax as well as pain medications including Toradol, acetaminophen, and 2 Percocet.  She was given Zofran for nausea and IV fluids I ordered medication including as above for sepsis Reevaluation of the patient after these medicines showed that the patient improved I have reviewed the patients home medicines and have made adjustments  as needed   Social Determinants of Health:  IV drug use   Test / Admission - Considered:  Admit to higher level of care, patient septic         Final Clinical Impression(s) / ED Diagnoses Final diagnoses:  Sepsis, due to unspecified organism, unspecified whether acute organ dysfunction present (HCC)  Cellulitis of abdominal wall     Eber Hong, MD 11/17/22 2303

## 2022-11-17 NOTE — Progress Notes (Signed)
CODE SEPSIS - PHARMACY COMMUNICATION  **Broad Spectrum Antibiotics should be administered within 1 hour of Sepsis diagnosis**  Time Code Sepsis Called/Page Received: 1756  Antibiotics Ordered: ceftriaxone, vancomycin  Time of 1st antibiotic administration: 1836  Additional action taken by pharmacy:    If necessary, Name of Provider/Nurse Contacted:      Angelique Blonder ,PharmD Clinical Pharmacist  11/17/2022  6:40 PM

## 2022-11-17 NOTE — Progress Notes (Signed)
Elink following for sepsis protocol. 

## 2022-11-17 NOTE — ED Notes (Signed)
Attempted x 2 for IV access without success.  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

## 2022-11-18 DIAGNOSIS — L03314 Cellulitis of groin: Secondary | ICD-10-CM | POA: Diagnosis not present

## 2022-11-18 DIAGNOSIS — F17200 Nicotine dependence, unspecified, uncomplicated: Secondary | ICD-10-CM | POA: Insufficient documentation

## 2022-11-18 DIAGNOSIS — R6520 Severe sepsis without septic shock: Secondary | ICD-10-CM | POA: Insufficient documentation

## 2022-11-18 DIAGNOSIS — F1910 Other psychoactive substance abuse, uncomplicated: Secondary | ICD-10-CM | POA: Insufficient documentation

## 2022-11-18 LAB — CBC
HCT: 35.2 % — ABNORMAL LOW (ref 36.0–46.0)
Hemoglobin: 11.5 g/dL — ABNORMAL LOW (ref 12.0–15.0)
MCH: 27.8 pg (ref 26.0–34.0)
MCHC: 32.7 g/dL (ref 30.0–36.0)
MCV: 85.2 fL (ref 80.0–100.0)
Platelets: 378 10*3/uL (ref 150–400)
RBC: 4.13 MIL/uL (ref 3.87–5.11)
RDW: 13.2 % (ref 11.5–15.5)
WBC: 14.3 10*3/uL — ABNORMAL HIGH (ref 4.0–10.5)
nRBC: 0 % (ref 0.0–0.2)

## 2022-11-18 LAB — COMPREHENSIVE METABOLIC PANEL
ALT: 22 U/L (ref 0–44)
AST: 19 U/L (ref 15–41)
Albumin: 2.8 g/dL — ABNORMAL LOW (ref 3.5–5.0)
Alkaline Phosphatase: 82 U/L (ref 38–126)
Anion gap: 10 (ref 5–15)
BUN: 13 mg/dL (ref 6–20)
CO2: 25 mmol/L (ref 22–32)
Calcium: 8.5 mg/dL — ABNORMAL LOW (ref 8.9–10.3)
Chloride: 101 mmol/L (ref 98–111)
Creatinine, Ser: 0.49 mg/dL (ref 0.44–1.00)
GFR, Estimated: >60 mL/min (ref >60)
Glucose, Bld: 113 mg/dL — ABNORMAL HIGH (ref 70–99)
Potassium: 3 mmol/L — ABNORMAL LOW (ref 3.5–5.1)
Sodium: 136 mmol/L (ref 135–145)
Total Bilirubin: 0.6 mg/dL (ref <1.2)
Total Protein: 6.6 g/dL (ref 6.5–8.1)

## 2022-11-18 LAB — LACTIC ACID, PLASMA: Lactic Acid, Venous: 0.7 mmol/L (ref 0.5–1.9)

## 2022-11-18 LAB — PHOSPHORUS: Phosphorus: 3.1 mg/dL (ref 2.5–4.6)

## 2022-11-18 LAB — MAGNESIUM: Magnesium: 1.7 mg/dL (ref 1.7–2.4)

## 2022-11-18 LAB — MRSA NEXT GEN BY PCR, NASAL: MRSA by PCR Next Gen: NOT DETECTED

## 2022-11-18 LAB — HIV ANTIBODY (ROUTINE TESTING W REFLEX): HIV Screen 4th Generation wRfx: NONREACTIVE

## 2022-11-18 MED ORDER — ENOXAPARIN SODIUM 40 MG/0.4ML IJ SOSY
40.0000 mg | PREFILLED_SYRINGE | INTRAMUSCULAR | Status: DC
  Administered 2022-11-18 – 2022-11-19 (×2): 40 mg via SUBCUTANEOUS
  Filled 2022-11-18 (×2): qty 0.4

## 2022-11-18 MED ORDER — POTASSIUM CHLORIDE 10 MEQ/100ML IV SOLN
10.0000 meq | INTRAVENOUS | Status: AC
  Administered 2022-11-18 (×4): 10 meq via INTRAVENOUS
  Filled 2022-11-18 (×4): qty 100

## 2022-11-18 MED ORDER — POTASSIUM CHLORIDE CRYS ER 20 MEQ PO TBCR
40.0000 meq | EXTENDED_RELEASE_TABLET | Freq: Two times a day (BID) | ORAL | Status: DC
  Administered 2022-11-18: 40 meq via ORAL
  Filled 2022-11-18: qty 2

## 2022-11-18 MED ORDER — ACETAMINOPHEN 325 MG PO TABS
650.0000 mg | ORAL_TABLET | Freq: Four times a day (QID) | ORAL | Status: DC | PRN
  Administered 2022-11-19: 650 mg via ORAL
  Filled 2022-11-18: qty 2

## 2022-11-18 MED ORDER — ACETAMINOPHEN 650 MG RE SUPP: 650.0000 mg | Freq: Four times a day (QID) | RECTAL | Status: DC | PRN

## 2022-11-18 MED ORDER — SODIUM CHLORIDE 0.9 % IV SOLN
2.0000 g | INTRAVENOUS | Status: DC
  Administered 2022-11-18: 2 g via INTRAVENOUS
  Filled 2022-11-18: qty 20

## 2022-11-18 MED ORDER — KETOROLAC TROMETHAMINE 15 MG/ML IJ SOLN
15.0000 mg | Freq: Once | INTRAMUSCULAR | Status: AC
  Administered 2022-11-18: 15 mg via INTRAVENOUS
  Filled 2022-11-18: qty 1

## 2022-11-18 MED ORDER — HYDROMORPHONE HCL 1 MG/ML IJ SOLN
0.5000 mg | INTRAMUSCULAR | Status: DC | PRN
  Administered 2022-11-18 – 2022-11-19 (×7): 0.5 mg via INTRAVENOUS
  Filled 2022-11-18 (×7): qty 0.5

## 2022-11-18 MED ORDER — OXYCODONE-ACETAMINOPHEN 5-325 MG PO TABS
1.0000 | ORAL_TABLET | ORAL | Status: DC | PRN
  Administered 2022-11-18 – 2022-11-19 (×5): 1 via ORAL
  Filled 2022-11-18 (×5): qty 1

## 2022-11-18 MED ORDER — OXYCODONE HCL 5 MG PO TABS
5.0000 mg | ORAL_TABLET | ORAL | Status: AC | PRN
  Administered 2022-11-18: 5 mg via ORAL
  Filled 2022-11-18: qty 1

## 2022-11-18 MED ORDER — ONDANSETRON HCL 4 MG/2ML IJ SOLN
4.0000 mg | Freq: Four times a day (QID) | INTRAMUSCULAR | Status: DC | PRN
  Administered 2022-11-18 (×2): 4 mg via INTRAVENOUS
  Filled 2022-11-18 (×2): qty 2

## 2022-11-18 MED ORDER — SODIUM CHLORIDE 0.9 % IV SOLN
INTRAVENOUS | Status: DC | PRN
Start: 2022-11-18 — End: 2022-11-19

## 2022-11-18 MED ORDER — ONDANSETRON HCL 4 MG PO TABS: 4.0000 mg | ORAL_TABLET | Freq: Four times a day (QID) | ORAL | Status: DC | PRN

## 2022-11-18 MED ORDER — LACTATED RINGERS IV SOLN: INTRAVENOUS | Status: AC

## 2022-11-18 NOTE — Plan of Care (Signed)

## 2022-11-18 NOTE — Progress Notes (Signed)
PROGRESS NOTE    [name]  ZDG:[id number] DOB: 02-Apr-1990 DOA: 11/17/2022 PCP: Patient, No Pcp Per   Brief Narrative:    Shelby Ferguson is a 32 y.o. female with medical history significant of tobacco use disorder, IV drug abuse (fentanyl) who presents to the emergency department due to pain, swelling and rash in the suprapubic region.  She was admitted for severe sepsis secondary to cellulitis on her groin and has been started on IV vancomycin.  MRSA has returned negative and she will be transition to Rocephin.  She continues to have significant pain as well as some nausea and vomiting.  Assessment & Plan:   Principal Problem:   Cellulitis of groin Active Problems:   Severe sepsis (HCC)   Tobacco use disorder   Drug abuse (HCC)  Assessment and Plan:   Severe sepsis secondary to cellulitis of groin Patient meets sepsis criteria due to tachycardia, leukocytosis (met SIRS criteria) and source of infection was cellulitis.  Lactic acid was 2.2, thereby met criteria for severe sepsis CT abdomen and pelvis with contrast showed cellulitis of the anterior pelvic wall and mons pubis. Switch IV vancomycin to Rocephin given MRSA negative Continue Tylenol as needed Continue oxycodone 5 milligram every 4 hours as needed Continue to monitor patient and consider IV fluid if hypotensive  Hypokalemia Replete and reevaluate in a.m.   Tobacco use disorder Patient was counseled on tobacco abuse cessation   IV drug abuse Patient abuses fentanyl She was advised on drug abuse cessation   Obesity BMI 30.54   DVT prophylaxis:Lovenox Code Status: Full Family Communication: None at bedside Disposition Plan:  Status is: Inpatient Remains inpatient appropriate because: Need for IV medications.   Consultants:  None  Procedures:  None  Antimicrobials:  Anti-infectives (From admission, onward)    Start     Dose/Rate Route Frequency Ordered Stop   11/18/22 1200  cefTRIAXone  (ROCEPHIN) 2 g in sodium chloride 0.9 % 100 mL IVPB        2 g 200 mL/hr over 30 Minutes Intravenous Every 24 hours 11/18/22 1109     11/18/22 0800  vancomycin (VANCOREADY) IVPB 1500 mg/300 mL  Status:  Discontinued        1,500 mg 150 mL/hr over 120 Minutes Intravenous Every 12 hours 11/17/22 1901 11/18/22 1109   11/17/22 2030  vancomycin (VANCOCIN) IVPB 1000 mg/200 mL premix        1,000 mg 200 mL/hr over 60 Minutes Intravenous  Once 11/17/22 1901 11/17/22 2118   11/17/22 1800  vancomycin (VANCOCIN) IVPB 1000 mg/200 mL premix        1,000 mg 200 mL/hr over 60 Minutes Intravenous  Once 11/17/22 1757 11/17/22 2011   11/17/22 1800  cefTRIAXone (ROCEPHIN) 2 g in sodium chloride 0.9 % 100 mL IVPB        2 g 200 mL/hr over 30 Minutes Intravenous  Once 11/17/22 1757 11/17/22 1859      Subjective: Patient seen and evaluated today with significant amounts of pain requiring IV Dilaudid and is also having nausea and vomiting.  Objective: Vitals:   11/18/22 0000 11/18/22 0024 11/18/22 0438 11/18/22 0911  BP: 114/76 116/82 113/81 (!) 134/90  Pulse: 78 65 74 66  Resp: 15 20 17 20   Temp:  97.8 F (36.6 C) 97.8 F (36.6 C) 98.2 F (36.8 C)  TempSrc:    Oral  SpO2: 99% 100% 97% 96%  Weight:      Height:        Intake/Output  Summary (Last 24 hours) at 11/18/2022 1136 Last data filed at 11/18/2022 1048 Gross per 24 hour  Intake 1620 ml  Output --  Net 1620 ml   Filed Weights   11/17/22 1743  Weight: 88.5 kg    Examination:  General exam: Appears calm and comfortable  Respiratory system: Clear to auscultation. Respiratory effort normal. Cardiovascular system: S1 & S2 heard, RRR.  Gastrointestinal system: Abdomen is soft Central nervous system: Alert and awake Extremities: No edema Skin: Erythema to groin and vaginal area Psychiatry: Flat affect.    Data Reviewed: I have personally reviewed following labs and imaging studies  CBC: Recent Labs  Lab 11/17/22 1830  11/18/22 0503  WBC 15.1* 14.3*  NEUTROABS 12.1*  --   HGB 12.7 11.5*  HCT 37.9 35.2*  MCV 85.4 85.2  PLT 407* 378   Basic Metabolic Panel: Recent Labs  Lab 11/17/22 1830 11/18/22 0503  NA 137 136  K 3.1* 3.0*  CL 98 101  CO2 27 25  GLUCOSE 121* 113*  BUN 14 13  CREATININE 0.71 0.49  CALCIUM 8.8* 8.5*  MG  --  1.7  PHOS  --  3.1   GFR: Estimated Creatinine Clearance: 117.5 mL/min (by C-G formula based on SCr of 0.49 mg/dL). Liver Function Tests: Recent Labs  Lab 11/17/22 1830 11/18/22 0503  AST 20 19  ALT 24 22  ALKPHOS 84 82  BILITOT 0.5 0.6  PROT 7.6 6.6  ALBUMIN 3.1* 2.8*   No results for input(s): "LIPASE", "AMYLASE" in the last 168 hours. No results for input(s): "AMMONIA" in the last 168 hours. Coagulation Profile: Recent Labs  Lab 11/17/22 1830  INR 1.2   Cardiac Enzymes: No results for input(s): "CKTOTAL", "CKMB", "CKMBINDEX", "TROPONINI" in the last 168 hours. BNP (last 3 results) No results for input(s): "PROBNP" in the last 8760 hours. HbA1C: No results for input(s): "HGBA1C" in the last 72 hours. CBG: No results for input(s): "GLUCAP" in the last 168 hours. Lipid Profile: No results for input(s): "CHOL", "HDL", "LDLCALC", "TRIG", "CHOLHDL", "LDLDIRECT" in the last 72 hours. Thyroid Function Tests: No results for input(s): "TSH", "T4TOTAL", "FREET4", "T3FREE", "THYROIDAB" in the last 72 hours. Anemia Panel: No results for input(s): "VITAMINB12", "FOLATE", "FERRITIN", "TIBC", "IRON", "RETICCTPCT" in the last 72 hours. Sepsis Labs: Recent Labs  Lab 11/17/22 1830 11/17/22 2038 11/17/22 2254 11/18/22 0105  LATICACIDVEN 1.7 2.2* 0.8 0.7    Recent Results (from the past 240 hour(s))  Blood Culture (routine x 2)     Status: None (Preliminary result)   Collection Time: 11/17/22  6:28 PM   Specimen: Right Antecubital; Blood  Result Value Ref Range Status   Specimen Description RIGHT ANTECUBITAL  Final   Special Requests   Final    BOTTLES  DRAWN AEROBIC AND ANAEROBIC Blood Culture adequate volume   Culture   Final    NO GROWTH < 12 HOURS Performed at [facility], [address] [place], [place] [address]    Report Status PENDING  Incomplete  Blood Culture (routine x 2)     Status: None (Preliminary result)   Collection Time: 11/17/22  6:30 PM   Specimen: Left Antecubital; Blood  Result Value Ref Range Status   Specimen Description LEFT ANTECUBITAL  Final   Special Requests   Final    BOTTLES DRAWN AEROBIC AND ANAEROBIC Blood Culture adequate volume   Culture   Final    NO GROWTH < 12 HOURS Performed at [facility], [address] [place], [place]  [address]    Report Status PENDING  Incomplete  Resp panel by RT-PCR (RSV, Flu A&B, Covid) Anterior Nasal Swab     Status: None   Collection Time: 11/17/22  6:38 PM   Specimen: Anterior Nasal Swab  Result Value Ref Range Status   SARS Coronavirus 2 by RT PCR NEGATIVE NEGATIVE Final    Comment: (NOTE) SARS-CoV-2 target nucleic acids are NOT DETECTED.  The SARS-CoV-2 RNA is generally detectable in upper respiratory specimens during the acute phase of infection. The lowest concentration of SARS-CoV-2 viral copies this assay can detect is 138 copies/mL. A negative result does not preclude SARS-Cov-2 infection and should not be used as the sole basis for treatment or other patient management decisions. A negative result may occur with  improper specimen collection/handling, submission of specimen other than nasopharyngeal swab, presence of viral mutation(s) within the areas targeted by this assay, and inadequate number of viral copies(<138 copies/mL). A negative result must be combined with clinical observations, patient history, and epidemiological information. The expected result is Negative.  Fact Sheet for Patients:  BloggerCourse.com  Fact Sheet for Healthcare Providers:  SeriousBroker.it  This test is no t yet  approved or cleared by the [place] FDA and  has been authorized for detection and/or diagnosis of SARS-CoV-2 by FDA under an Emergency Use Authorization (EUA). This EUA will remain  in effect (meaning this test can be used) for the duration of the COVID-19 declaration under Section 564(b)(1) of the Act, 21 U.S.C.section 360bbb-3(b)(1), unless the authorization is terminated  or revoked sooner.       Influenza A by PCR NEGATIVE NEGATIVE Final   Influenza B by PCR NEGATIVE NEGATIVE Final    Comment: (NOTE) The Xpert Xpress SARS-CoV-2/FLU/RSV plus assay is intended as an aid in the diagnosis of influenza from Nasopharyngeal swab specimens and should not be used as a sole basis for treatment. Nasal washings and aspirates are unacceptable for Xpert Xpress SARS-CoV-2/FLU/RSV testing.  Fact Sheet for Patients: BloggerCourse.com  Fact Sheet for Healthcare Providers: SeriousBroker.it  This test is not yet approved or cleared by the [place] FDA and has been authorized for detection and/or diagnosis of SARS-CoV-2 by FDA under an Emergency Use Authorization (EUA). This EUA will remain in effect (meaning this test can be used) for the duration of the COVID-19 declaration under Section 564(b)(1) of the Act, 21 U.S.C. section 360bbb-3(b)(1), unless the authorization is terminated or revoked.     Resp Syncytial Virus by PCR NEGATIVE NEGATIVE Final    Comment: (NOTE) Fact Sheet for Patients: BloggerCourse.com  Fact Sheet for Healthcare Providers: SeriousBroker.it  This test is not yet approved or cleared by the [place] FDA and has been authorized for detection and/or diagnosis of SARS-CoV-2 by FDA under an Emergency Use Authorization (EUA). This EUA will remain in effect (meaning this test can be used) for the duration of the COVID-19 declaration under Section 564(b)(1) of  the Act, 21 U.S.C. section 360bbb-3(b)(1), unless the authorization is terminated or revoked.  Performed at [facility], [address] [place], [place] [address]   MRSA Next Gen by PCR, Nasal     Status: None   Collection Time: 11/18/22  8:45 AM   Specimen: Nasal Mucosa; Nasal Swab  Result Value Ref Range Status   MRSA by PCR Next Gen NOT DETECTED NOT DETECTED Final    Comment: (NOTE) The GeneXpert MRSA Assay (FDA approved for NASAL specimens only), is one component of a comprehensive MRSA colonization surveillance program. It  is not intended to diagnose MRSA infection nor to guide or monitor treatment for MRSA infections. Test performance is not FDA approved in patients less than 37 years old. Performed at [facility], [address], [place], [place] [address]          Radiology Studies: CT ABDOMEN PELVIS W CONTRAST  Result Date: 11/17/2022 CLINICAL DATA:  Abdominal abscess in the groin area. EXAM: CT ABDOMEN AND PELVIS WITH CONTRAST TECHNIQUE: Multidetector CT imaging of the abdomen and pelvis was performed using the standard protocol following bolus administration of intravenous contrast. RADIATION DOSE REDUCTION: This exam was performed according to the departmental dose-optimization program which includes automated exposure control, adjustment of the mA and/or kV according to patient size and/or use of iterative reconstruction technique. CONTRAST:  OMNIPAQUE IOHEXOL 300 MG/ML  SOLN COMPARISON:  None Available. FINDINGS: Lower chest: The visualized lung bases are clear. No intra-abdominal free air or free fluid. Hepatobiliary: Probable mild fatty liver. No biliary dilatation. The gallbladder is unremarkable. Pancreas: Unremarkable. No pancreatic ductal dilatation or surrounding inflammatory changes. Spleen: Normal in size without focal abnormality. Adrenals/Urinary Tract: The adrenal glands unremarkable. The kidneys, visualized ureters, and urinary bladder unremarkable.  Stomach/Bowel: There is moderate stool throughout the colon. No bowel obstruction or active inflammation. The appendix is normal. Vascular/Lymphatic: The abdominal aorta and IVC are unremarkable. No portal venous gas. There is no adenopathy. Reproductive: The uterus is anteverted and grossly unremarkable. Fluid-filled tubular structure in the region of the adnexa bilaterally may represent small bowel or hydrosalpinx. But concerning for hydrosalpinx. Clinical correlation is recommended ultrasound may provide better evaluation of the pelvic structures. Other: There is thickening of the skin and inflammatory changes of the subcutaneous soft tissues of the anterior pelvic wall and mons pubis consistent with cellulitis. Somewhat lobulated fluid collections in the subcutaneous fat represent edema or phlegmonous changes. No drainable fluid collection/abscess or soft tissue gas. Musculoskeletal: No acute or significant osseous findings. IMPRESSION: 1. Cellulitis of the anterior pelvic wall and mons pubis. No drainable fluid collection/abscess or soft tissue gas. 2. 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 Ultrasound may provide better evaluation of the pelvic structures. 3. Moderate colonic stool burden. No bowel obstruction. Normal appendix. Electronically Signed   By: Elgie Collard M.D.   On: 11/17/2022 22:27   DG Chest Port 1 View  Result Date: 11/17/2022 CLINICAL DATA:  Possible sepsis EXAM: PORTABLE CHEST 1 VIEW COMPARISON:  02/11/2016 FINDINGS: The heart size and mediastinal contours are within normal limits. Both lungs are clear. The visualized skeletal structures are unremarkable. IMPRESSION: No active disease. Electronically Signed   By: Alcide Clever M.D.   On: 11/17/2022 20:58        Scheduled Meds:  enoxaparin (LOVENOX) injection  40 mg Subcutaneous Q24H   sodium chloride flush  10 mL Intravenous Q12H   Continuous Infusions:  cefTRIAXone (ROCEPHIN)  IV     lactated ringers     potassium chloride        LOS: 1 day    Time spent: 55 minutes    Kyliegh Jester Hoover Brunette, DO [facility]  If 7PM-7AM, please contact night-coverage [email or web address] 11/18/2022, 11:36 AM

## 2022-11-18 NOTE — Progress Notes (Addendum)
Has rated pain mostly 7 and above for cellulitis area. Has received dilaudid and percocet.  Area has expanded some past original marking on admission. Nausea and vomiting earlier but given zofran and that has improved. Received four runs of potassium.  In shower now and mother assisting.

## 2022-11-18 NOTE — ED Notes (Signed)
ED TO INPATIENT HANDOFF REPORT  ED Nurse Name and Phone #: jess F   S Name/Age/Gender Shelby Ferguson 32 y.o. female Room/Bed: APA10/APA10  Code Status   Code Status: Not on file  Home/SNF/Other Home Patient oriented to: self, place, time, and situation Is this baseline? Yes   Triage Complete: Triage complete  Chief Complaint Cellulitis of groin [L03.314]  Triage Note Pt c/o upper groin nodule that started off like a pimple or in grown hair and she popped it and now states it keeps growing.    Allergies No Known Allergies  Level of Care/Admitting Diagnosis ED Disposition     ED Disposition  Admit   Condition  --   Comment  Hospital Area: [facility] [[id number]]  Level of Care: Med-Surg [16]  Covid Evaluation: Asymptomatic - no recent exposure (last 10 days) testing not required  Diagnosis: Cellulitis of groin [164100]  Admitting Physician: Frankey Shown [[id number]]  Attending Physician: Frankey Shown [[id number]]  Certification:: I certify this patient will need inpatient services for at least 2 midnights  Expected Medical Readiness: 11/20/2022          B Medical/Surgery History Past Medical History:  Diagnosis Date   Medical history non-contributory    Past Surgical History:  Procedure Laterality Date   dental removal     TONSILLECTOMY       A IV Location/Drains/Wounds Patient Lines/Drains/Airways Status     Active Line/Drains/Airways     Name Placement date Placement time Site Days   Peripheral IV 11/17/22 20 G Left Antecubital 11/17/22  1834  Antecubital  1            Intake/Output Last 24 hours  Intake/Output Summary (Last 24 hours) at 11/18/2022 0000 Last data filed at 11/17/2022 2359 Gross per 24 hour  Intake 1500 ml  Output --  Net 1500 ml    Labs/Imaging Results for orders placed or performed during the hospital encounter of 11/17/22 (from the past 48 hour(s))  Blood Culture (routine x 2)     Status: None  (Preliminary result)   Collection Time: 11/17/22  6:28 PM   Specimen: Right Antecubital; Blood  Result Value Ref Range   Specimen Description RIGHT ANTECUBITAL    Special Requests      BOTTLES DRAWN AEROBIC AND ANAEROBIC Blood Culture adequate volume Performed at [facility], [address], [place], [place] [address]    Culture PENDING    Report Status PENDING   Lactic acid, plasma     Status: None   Collection Time: 11/17/22  6:30 PM  Result Value Ref Range   Lactic Acid, Venous 1.7 0.5 - 1.9 mmol/L    Comment: Performed at [facility], [address] [place], [place] [address]  Comprehensive metabolic panel     Status: Abnormal   Collection Time: 11/17/22  6:30 PM  Result Value Ref Range   Sodium 137 135 - 145 mmol/L   Potassium 3.1 (L) 3.5 - 5.1 mmol/L   Chloride 98 98 - 111 mmol/L   CO2 27 22 - 32 mmol/L   Glucose, Bld 121 (H) 70 - 99 mg/dL    Comment: Glucose reference range applies only to samples taken after fasting for at least 8 hours.   BUN 14 6 - 20 mg/dL   Creatinine, Ser 5.28 0.44 - 1.00 mg/dL   Calcium 8.8 (L) 8.9 - 10.3 mg/dL   Total Protein 7.6 6.5 - 8.1 g/dL   Albumin 3.1 (L) 3.5 - 5.0 g/dL  AST 20 15 - 41 U/L   ALT 24 0 - 44 U/L   Alkaline Phosphatase 84 38 - 126 U/L   Total Bilirubin 0.5 <1.2 mg/dL   GFR, Estimated >16 >10 mL/min    Comment: (NOTE) Calculated using the CKD-EPI Creatinine Equation (2021)    Anion gap 12 5 - 15    Comment: Performed at [facility], [address] [place], [place] [address]  CBC with Differential     Status: Abnormal   Collection Time: 11/17/22  6:30 PM  Result Value Ref Range   WBC 15.1 (H) 4.0 - 10.5 K/uL   RBC 4.44 3.87 - 5.11 MIL/uL   Hemoglobin 12.7 12.0 - 15.0 g/dL   HCT 40.9 81.1 - 91.4 %   MCV 85.4 80.0 - 100.0 fL   MCH 28.6 26.0 - 34.0 pg   MCHC 33.5 30.0 - 36.0 g/dL   RDW 78.2 95.6 - 21.3 %   Platelets 407 (H) 150 - 400 K/uL   nRBC 0.0 0.0 - 0.2 %   Neutrophils Relative % 80 %   Neutro Abs 12.1 (H)  1.7 - 7.7 K/uL   Lymphocytes Relative 15 %   Lymphs Abs 2.3 0.7 - 4.0 K/uL   Monocytes Relative 4 %   Monocytes Absolute 0.6 0.1 - 1.0 K/uL   Eosinophils Relative 0 %   Eosinophils Absolute 0.0 0.0 - 0.5 K/uL   Basophils Relative 0 %   Basophils Absolute 0.1 0.0 - 0.1 K/uL   Immature Granulocytes 1 %   Abs Immature Granulocytes 0.07 0.00 - 0.07 K/uL    Comment: Performed at [facility], [address], [place], [place] [address]  Protime-INR     Status: None   Collection Time: 11/17/22  6:30 PM  Result Value Ref Range   Prothrombin Time 15.2 11.4 - 15.2 seconds   INR 1.2 0.8 - 1.2    Comment: (NOTE) INR goal varies based on device and disease states. Performed at [facility], [address], [place], [place] [address]   APTT     Status: Abnormal   Collection Time: 11/17/22  6:30 PM  Result Value Ref Range   aPTT 22 (L) 24 - 36 seconds    Comment: Performed at [facility], [address] [place], [place] [address]  Blood Culture (routine x 2)     Status: None (Preliminary result)   Collection Time: 11/17/22  6:30 PM   Specimen: Left Antecubital; Blood  Result Value Ref Range   Specimen Description LEFT ANTECUBITAL    Special Requests      BOTTLES DRAWN AEROBIC AND ANAEROBIC Blood Culture adequate volume Performed at [facility], [address] [place], [place] [address]    Culture PENDING    Report Status PENDING   Resp panel by RT-PCR (RSV, Flu A&B, Covid) Anterior Nasal Swab     Status: None   Collection Time: 11/17/22  6:38 PM   Specimen: Anterior Nasal Swab  Result Value Ref Range   SARS Coronavirus 2 by RT PCR NEGATIVE NEGATIVE    Comment: (NOTE) SARS-CoV-2 target nucleic acids are NOT DETECTED.  The SARS-CoV-2 RNA is generally detectable in upper respiratory specimens during the acute phase of infection. The lowest concentration of SARS-CoV-2 viral copies this assay can detect is 138 copies/mL. A negative result does not preclude SARS-Cov-2 infection and should  not be used as the sole basis for treatment or other patient management decisions. A negative result may occur with  improper specimen  collection/handling, submission of specimen other than nasopharyngeal swab, presence of viral mutation(s) within the areas targeted by this assay, and inadequate number of viral copies(<138 copies/mL). A negative result must be combined with clinical observations, patient history, and epidemiological information. The expected result is Negative.  Fact Sheet for Patients:  BloggerCourse.com  Fact Sheet for Healthcare Providers:  SeriousBroker.it  This test is no t yet approved or cleared by the [place] FDA and  has been authorized for detection and/or diagnosis of SARS-CoV-2 by FDA under an Emergency Use Authorization (EUA). This EUA will remain  in effect (meaning this test can be used) for the duration of the COVID-19 declaration under Section 564(b)(1) of the Act, 21 U.S.C.section 360bbb-3(b)(1), unless the authorization is terminated  or revoked sooner.       Influenza A by PCR NEGATIVE NEGATIVE   Influenza B by PCR NEGATIVE NEGATIVE    Comment: (NOTE) The Xpert Xpress SARS-CoV-2/FLU/RSV plus assay is intended as an aid in the diagnosis of influenza from Nasopharyngeal swab specimens and should not be used as a sole basis for treatment. Nasal washings and aspirates are unacceptable for Xpert Xpress SARS-CoV-2/FLU/RSV testing.  Fact Sheet for Patients: BloggerCourse.com  Fact Sheet for Healthcare Providers: SeriousBroker.it  This test is not yet approved or cleared by the [place] FDA and has been authorized for detection and/or diagnosis of SARS-CoV-2 by FDA under an Emergency Use Authorization (EUA). This EUA will remain in effect (meaning this test can be used) for the duration of the COVID-19 declaration under Section 564(b)(1)  of the Act, 21 U.S.C. section 360bbb-3(b)(1), unless the authorization is terminated or revoked.     Resp Syncytial Virus by PCR NEGATIVE NEGATIVE    Comment: (NOTE) Fact Sheet for Patients: BloggerCourse.com  Fact Sheet for Healthcare Providers: SeriousBroker.it  This test is not yet approved or cleared by the [place] FDA and has been authorized for detection and/or diagnosis of SARS-CoV-2 by FDA under an Emergency Use Authorization (EUA). This EUA will remain in effect (meaning this test can be used) for the duration of the COVID-19 declaration under Section 564(b)(1) of the Act, 21 U.S.C. section 360bbb-3(b)(1), unless the authorization is terminated or revoked.  Performed at [facility], [address], [place], [place] [address]   Urinalysis, w/ Reflex to Culture (Infection Suspected) -Urine, Clean Catch     Status: Abnormal   Collection Time: 11/17/22  8:00 PM  Result Value Ref Range   Specimen Source URINE, CLEAN CATCH    Color, Urine AMBER (A) YELLOW    Comment: BIOCHEMICALS MAY BE AFFECTED BY COLOR   APPearance CLOUDY (A) CLEAR   Specific Gravity, Urine >1.046 (H) 1.005 - 1.030   pH 5.0 5.0 - 8.0   Glucose, UA NEGATIVE NEGATIVE mg/dL   Hgb urine dipstick NEGATIVE NEGATIVE   Bilirubin Urine MODERATE (A) NEGATIVE   Ketones, ur NEGATIVE NEGATIVE mg/dL   Protein, ur 621 (A) NEGATIVE mg/dL   Nitrite NEGATIVE NEGATIVE   Leukocytes,Ua NEGATIVE NEGATIVE   RBC / HPF 11-20 0 - 5 RBC/hpf   WBC, UA 6-10 0 - 5 WBC/hpf    Comment:        Reflex urine culture not performed if WBC <=10, OR if Squamous epithelial cells >5. If Squamous epithelial cells >5 suggest recollection.    Bacteria, UA NONE SEEN NONE SEEN   Squamous Epithelial / HPF 21-50 0 - 5 /HPF   Mucus PRESENT     Comment: Performed at [facility], [address] [place], [place] [address]  Pregnancy, urine     Status: None   Collection Time: 11/17/22  8:00  PM  Result Value Ref Range   Preg Test, Ur NEGATIVE NEGATIVE    Comment:        THE SENSITIVITY OF THIS METHODOLOGY IS >25 mIU/mL. Performed at [facility], [address], [place], [place] [address]   Lactic acid, plasma     Status: Abnormal   Collection Time: 11/17/22  8:38 PM  Result Value Ref Range   Lactic Acid, Venous 2.2 (HH) 0.5 - 1.9 mmol/L    Comment: CRITICAL RESULT CALLED TO, READ BACK BY AND VERIFIED WITH Mckenzey Parcell,J @ 2131 ON 11/17/22 BY JUW Performed at [facility], [address] [place], [place] [address]   Lactic acid, plasma     Status: None   Collection Time: 11/17/22 10:54 PM  Result Value Ref Range   Lactic Acid, Venous 0.8 0.5 - 1.9 mmol/L    Comment: Performed at [facility] [address], [place], [place] [address]   CT ABDOMEN PELVIS W CONTRAST  Result Date: 11/17/2022 CLINICAL DATA:  Abdominal abscess in the groin area. EXAM: CT ABDOMEN AND PELVIS WITH CONTRAST TECHNIQUE: Multidetector CT imaging of the abdomen and pelvis was performed using the standard protocol following bolus administration of intravenous contrast. RADIATION DOSE REDUCTION: This exam was performed according to the departmental dose-optimization program which includes automated exposure control, adjustment of the mA and/or kV according to patient size and/or use of iterative reconstruction technique. CONTRAST:  OMNIPAQUE IOHEXOL 300 MG/ML  SOLN COMPARISON:  None Available. FINDINGS: Lower chest: The visualized lung bases are clear. No intra-abdominal free air or free fluid. Hepatobiliary: Probable mild fatty liver. No biliary dilatation. The gallbladder is unremarkable. Pancreas: Unremarkable. No pancreatic ductal dilatation or surrounding inflammatory changes. Spleen: Normal in size without focal abnormality. Adrenals/Urinary Tract: The adrenal glands unremarkable. The kidneys, visualized ureters, and urinary bladder unremarkable. Stomach/Bowel: There is moderate stool throughout the  colon. No bowel obstruction or active inflammation. The appendix is normal. Vascular/Lymphatic: The abdominal aorta and IVC are unremarkable. No portal venous gas. There is no adenopathy. Reproductive: The uterus is anteverted and grossly unremarkable. Fluid-filled tubular structure in the region of the adnexa bilaterally may represent small bowel or hydrosalpinx. But concerning for hydrosalpinx. Clinical correlation is recommended ultrasound may provide better evaluation of the pelvic structures. Other: There is thickening of the skin and inflammatory changes of the subcutaneous soft tissues of the anterior pelvic wall and mons pubis consistent with cellulitis. Somewhat lobulated fluid collections in the subcutaneous fat represent edema or phlegmonous changes. No drainable fluid collection/abscess or soft tissue gas. Musculoskeletal: No acute or significant osseous findings. IMPRESSION: 1. Cellulitis of the anterior pelvic wall and mons pubis. No drainable fluid collection/abscess or soft tissue gas. 2. 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 Ultrasound may provide better evaluation of the pelvic structures. 3. Moderate colonic stool burden. No bowel obstruction. Normal appendix. Electronically Signed   By: Elgie Collard M.D.   On: 11/17/2022 22:27   DG Chest Port 1 View  Result Date: 11/17/2022 CLINICAL DATA:  Possible sepsis EXAM: PORTABLE CHEST 1 VIEW COMPARISON:  02/11/2016 FINDINGS: The heart size and mediastinal contours are within normal limits. Both lungs are clear. The visualized skeletal structures are unremarkable. IMPRESSION: No active disease. Electronically Signed   By: Alcide Clever M.D.   On: 11/17/2022 20:58    Pending Labs Unresulted Labs (From admission, onward)     Start  Ordered   11/18/22 0500  Creatinine, serum  Tomorrow morning,   R        11/17/22 1901   11/17/22 2232  Lactic acid, plasma  (Lactic Acid)  Now then every 2 hours,   R (with STAT occurrences)      11/17/22 2231             Vitals/Pain Today's Vitals   11/17/22 2115 11/17/22 2215 11/17/22 2245 11/17/22 2300  BP: (!) 124/94 112/71 116/80 115/77  Pulse: 95 84 78 72  Resp: 19 15 14 14   Temp:      TempSrc:      SpO2: 100% 100% 98% 100%  Weight:      Height:      PainSc:        Isolation Precautions No active isolations  Medications Medications  sodium chloride flush (NS) 0.9 % injection 10 mL (10 mLs Intravenous Given 11/17/22 2118)  vancomycin (VANCOREADY) IVPB 1500 mg/300 mL (has no administration in time range)  vancomycin (VANCOCIN) IVPB 1000 mg/200 mL premix (0 mg Intravenous Stopped 11/17/22 2011)  cefTRIAXone (ROCEPHIN) 2 g in sodium chloride 0.9 % 100 mL IVPB (0 g Intravenous Stopped 11/17/22 1859)  acetaminophen (TYLENOL) tablet 1,000 mg (1,000 mg Oral Given 11/17/22 1902)  vancomycin (VANCOCIN) IVPB 1000 mg/200 mL premix (0 mg Intravenous Stopped 11/17/22 2118)  ketorolac (TORADOL) 30 MG/ML injection 30 mg (30 mg Intravenous Given 11/17/22 2050)  iohexol (OMNIPAQUE) 300 MG/ML solution 100 mL (100 mLs Intravenous Contrast Given 11/17/22 2145)  sodium chloride 0.9 % bolus 1,000 mL (0 mLs Intravenous Stopped 11/17/22 2359)  ondansetron (ZOFRAN) injection 4 mg (4 mg Intravenous Given 11/17/22 2257)  oxyCODONE-acetaminophen (PERCOCET/ROXICET) 5-325 MG per tablet 2 tablet (2 tablets Oral Given 11/17/22 2257)    Mobility walks     Focused Assessments N/a   R Recommendations: See Admitting Provider Note  Report given to:   Additional Notes: n/a

## 2022-11-19 DIAGNOSIS — L03314 Cellulitis of groin: Secondary | ICD-10-CM | POA: Diagnosis not present

## 2022-11-19 LAB — BASIC METABOLIC PANEL
Anion gap: 10 (ref 5–15)
BUN: 9 mg/dL (ref 6–20)
CO2: 23 mmol/L (ref 22–32)
Calcium: 8.2 mg/dL — ABNORMAL LOW (ref 8.9–10.3)
Chloride: 101 mmol/L (ref 98–111)
Creatinine, Ser: 0.51 mg/dL (ref 0.44–1.00)
GFR, Estimated: >60 mL/min (ref >60)
Glucose, Bld: 108 mg/dL — ABNORMAL HIGH (ref 70–99)
Potassium: 3.3 mmol/L — ABNORMAL LOW (ref 3.5–5.1)
Sodium: 134 mmol/L — ABNORMAL LOW (ref 135–145)

## 2022-11-19 LAB — CBC
HCT: 32.8 % — ABNORMAL LOW (ref 36.0–46.0)
Hemoglobin: 10.7 g/dL — ABNORMAL LOW (ref 12.0–15.0)
MCH: 27.5 pg (ref 26.0–34.0)
MCHC: 32.6 g/dL (ref 30.0–36.0)
MCV: 84.3 fL (ref 80.0–100.0)
Platelets: 413 10*3/uL — ABNORMAL HIGH (ref 150–400)
RBC: 3.89 MIL/uL (ref 3.87–5.11)
RDW: 13.3 % (ref 11.5–15.5)
WBC: 11.2 10*3/uL — ABNORMAL HIGH (ref 4.0–10.5)
nRBC: 0 % (ref 0.0–0.2)

## 2022-11-19 LAB — MAGNESIUM: Magnesium: 1.6 mg/dL — ABNORMAL LOW (ref 1.7–2.4)

## 2022-11-19 NOTE — Progress Notes (Signed)
Patient signed out AMA, MD and [facility] aware. Nursing staff informed patient of the need for continued IV ABX and the risks involved in leaving without completing treatment.  Patient states understanding of all risks and that she is responsible.

## 2022-11-19 NOTE — Plan of Care (Signed)
Patient remains alert and oriented. Pain untouched by ordered medication. Rotating between Hydromorphone and Percocet with minimal relief per patient. Will continue to monitor.   Problem: Education: Goal: Knowledge of General Education information will improve Description: Including pain rating scale, medication(s)/side effects and non-pharmacologic comfort measures Outcome: Progressing   Problem: Clinical Measurements: Goal: Ability to maintain clinical measurements within normal limits will improve Outcome: Progressing   Problem: Safety: Goal: Ability to remain free from injury will improve Outcome: Progressing   Problem: Coping: Goal: Level of anxiety will decrease Outcome: Not Progressing   Problem: Pain Management: Goal: General experience of comfort will improve Outcome: Not Progressing   Problem: Skin Integrity: Goal: Risk for impaired skin integrity will decrease Outcome: Not Progressing

## 2022-11-19 NOTE — Discharge Summary (Signed)
Physician Discharge Summary  Shelby Ferguson ZOX:[id number] DOB: 12-Apr-1990 DOA: 11/17/2022  PCP: Patient, No Pcp Per  Admit date: 11/17/2022  Discharge date: 11/19/2022 [facility] DISCHARGE  Admitted From:Home  Disposition:  AMA DISCHARGE   Brief/Interim Summary:  Shelby Ferguson is a 32 y.o. female with medical history significant of tobacco use disorder, IV drug abuse (fentanyl) who presents to the emergency department due to pain, swelling and rash in the suprapubic region.  She was admitted for severe sepsis secondary to cellulitis on her groin and has been started on IV vancomycin.  MRSA has returned negative and she was transitioned to Rocephin.  On the morning of 11/17 she decided to leave AGAINST MEDICAL ADVICE despite my convincing her to remain in the hospital due to her acute infection.  She had full decision-making capacity and was alert and oriented x 4 when she made the decision.  She understood the risks of leaving AGAINST MEDICAL ADVICE.  Discharge Diagnoses:  Principal Problem:   Cellulitis of groin Active Problems:   Severe sepsis (HCC)   Tobacco use disorder   Drug abuse (HCC)  Principal discharge diagnosis: Groin cellulitis.  No Known Allergies  Consultations: None   Procedures/Studies: CT ABDOMEN PELVIS W CONTRAST  Result Date: 11/17/2022 CLINICAL DATA:  Abdominal abscess in the groin area. EXAM: CT ABDOMEN AND PELVIS WITH CONTRAST TECHNIQUE: Multidetector CT imaging of the abdomen and pelvis was performed using the standard protocol following bolus administration of intravenous contrast. RADIATION DOSE REDUCTION: This exam was performed according to the departmental dose-optimization program which includes automated exposure control, adjustment of the mA and/or kV according to patient size and/or use of iterative reconstruction technique. CONTRAST:  OMNIPAQUE IOHEXOL 300 MG/ML  SOLN COMPARISON:  None Available. FINDINGS: Lower chest: The visualized lung  bases are clear. No intra-abdominal free air or free fluid. Hepatobiliary: Probable mild fatty liver. No biliary dilatation. The gallbladder is unremarkable. Pancreas: Unremarkable. No pancreatic ductal dilatation or surrounding inflammatory changes. Spleen: Normal in size without focal abnormality. Adrenals/Urinary Tract: The adrenal glands unremarkable. The kidneys, visualized ureters, and urinary bladder unremarkable. Stomach/Bowel: There is moderate stool throughout the colon. No bowel obstruction or active inflammation. The appendix is normal. Vascular/Lymphatic: The abdominal aorta and IVC are unremarkable. No portal venous gas. There is no adenopathy. Reproductive: The uterus is anteverted and grossly unremarkable. Fluid-filled tubular structure in the region of the adnexa bilaterally may represent small bowel or hydrosalpinx. But concerning for hydrosalpinx. Clinical correlation is recommended ultrasound may provide better evaluation of the pelvic structures. Other: There is thickening of the skin and inflammatory changes of the subcutaneous soft tissues of the anterior pelvic wall and mons pubis consistent with cellulitis. Somewhat lobulated fluid collections in the subcutaneous fat represent edema or phlegmonous changes. No drainable fluid collection/abscess or soft tissue gas. Musculoskeletal: No acute or significant osseous findings. IMPRESSION: 1. Cellulitis of the anterior pelvic wall and mons pubis. No drainable fluid collection/abscess or soft tissue gas. 2. 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 Ultrasound may provide better evaluation of the pelvic structures. 3. Moderate colonic stool burden. No bowel obstruction. Normal appendix. Electronically Signed   By: Elgie Collard M.D.   On: 11/17/2022 22:27   DG Chest Port 1 View  Result Date: 11/17/2022 CLINICAL DATA:  Possible sepsis EXAM: PORTABLE CHEST 1 VIEW COMPARISON:  02/11/2016 FINDINGS: The heart size and mediastinal contours are within  normal limits. Both lungs are clear. The visualized skeletal structures are unremarkable. IMPRESSION: No active disease. Electronically Signed   By: Loraine Leriche  Lukens M.D.   On: 11/17/2022 20:58     Discharge Exam: Vitals:   11/18/22 1900 11/19/22 0500  BP: (!) 122/94 123/84  Pulse: 90 89  Resp: 18 19  Temp: 98.5 F (36.9 C) 98.7 F (37.1 C)  SpO2: 98% 100%   Vitals:   11/18/22 0911 11/18/22 1300 11/18/22 1900 11/19/22 0500  BP: (!) 134/90 128/87 (!) 122/94 123/84  Pulse: 66 84 90 89  Resp: 20 18 18 19   Temp: 98.2 F (36.8 C) 98 F (36.7 C) 98.5 F (36.9 C) 98.7 F (37.1 C)  TempSrc: Oral Oral Oral Oral  SpO2: 96% 100% 98% 100%  Weight:      Height:        General: Pt is alert, awake, not in acute distress Cardiovascular: RRR, S1/S2 +, no rubs, no gallops Respiratory: CTA bilaterally, no wheezing, no rhonchi Abdominal: Soft, NT, ND, bowel sounds + Extremities: no edema, no cyanosis    The results of significant diagnostics from this hospitalization (including imaging, microbiology, ancillary and laboratory) are listed below for reference.     Microbiology: Recent Results (from the past 240 hour(s))  Blood Culture (routine x 2)     Status: None (Preliminary result)   Collection Time: 11/17/22  6:28 PM   Specimen: Right Antecubital; Blood  Result Value Ref Range Status   Specimen Description RIGHT ANTECUBITAL  Final   Special Requests   Final    BOTTLES DRAWN AEROBIC AND ANAEROBIC Blood Culture adequate volume   Culture   Final    NO GROWTH 2 DAYS Performed at [facility], [address], [place], [place] [address]    Report Status PENDING  Incomplete  Blood Culture (routine x 2)     Status: None (Preliminary result)   Collection Time: 11/17/22  6:30 PM   Specimen: Left Antecubital; Blood  Result Value Ref Range Status   Specimen Description LEFT ANTECUBITAL  Final   Special Requests   Final    BOTTLES DRAWN AEROBIC AND ANAEROBIC Blood Culture adequate volume    Culture   Final    NO GROWTH 2 DAYS Performed at [facility], [address], [place], [place] [address]    Report Status PENDING  Incomplete  Resp panel by RT-PCR (RSV, Flu A&B, Covid) Anterior Nasal Swab     Status: None   Collection Time: 11/17/22  6:38 PM   Specimen: Anterior Nasal Swab  Result Value Ref Range Status   SARS Coronavirus 2 by RT PCR NEGATIVE NEGATIVE Final    Comment: (NOTE) SARS-CoV-2 target nucleic acids are NOT DETECTED.  The SARS-CoV-2 RNA is generally detectable in upper respiratory specimens during the acute phase of infection. The lowest concentration of SARS-CoV-2 viral copies this assay can detect is 138 copies/mL. A negative result does not preclude SARS-Cov-2 infection and should not be used as the sole basis for treatment or other patient management decisions. A negative result may occur with  improper specimen collection/handling, submission of specimen other than nasopharyngeal swab, presence of viral mutation(s) within the areas targeted by this assay, and inadequate number of viral copies(<138 copies/mL). A negative result must be combined with clinical observations, patient history, and epidemiological information. The expected result is Negative.  Fact Sheet for Patients:  BloggerCourse.com  Fact Sheet for Healthcare Providers:  SeriousBroker.it  This test is no t yet approved or cleared by the [place] FDA and  has been authorized for detection and/or diagnosis of SARS-CoV-2 by FDA under an Emergency Use Authorization (  EUA). This EUA will remain  in effect (meaning this test can be used) for the duration of the COVID-19 declaration under Section 564(b)(1) of the Act, 21 U.S.C.section 360bbb-3(b)(1), unless the authorization is terminated  or revoked sooner.       Influenza A by PCR NEGATIVE NEGATIVE Final   Influenza B by PCR NEGATIVE NEGATIVE Final    Comment: (NOTE) The Xpert  Xpress SARS-CoV-2/FLU/RSV plus assay is intended as an aid in the diagnosis of influenza from Nasopharyngeal swab specimens and should not be used as a sole basis for treatment. Nasal washings and aspirates are unacceptable for Xpert Xpress SARS-CoV-2/FLU/RSV testing.  Fact Sheet for Patients: BloggerCourse.com  Fact Sheet for Healthcare Providers: SeriousBroker.it  This test is not yet approved or cleared by the [place] FDA and has been authorized for detection and/or diagnosis of SARS-CoV-2 by FDA under an Emergency Use Authorization (EUA). This EUA will remain in effect (meaning this test can be used) for the duration of the COVID-19 declaration under Section 564(b)(1) of the Act, 21 U.S.C. section 360bbb-3(b)(1), unless the authorization is terminated or revoked.     Resp Syncytial Virus by PCR NEGATIVE NEGATIVE Final    Comment: (NOTE) Fact Sheet for Patients: BloggerCourse.com  Fact Sheet for Healthcare Providers: SeriousBroker.it  This test is not yet approved or cleared by the [place] FDA and has been authorized for detection and/or diagnosis of SARS-CoV-2 by FDA under an Emergency Use Authorization (EUA). This EUA will remain in effect (meaning this test can be used) for the duration of the COVID-19 declaration under Section 564(b)(1) of the Act, 21 U.S.C. section 360bbb-3(b)(1), unless the authorization is terminated or revoked.  Performed at [facility], [address] [place], [place] [address]   MRSA Next Gen by PCR, Nasal     Status: None   Collection Time: 11/18/22  8:45 AM   Specimen: Nasal Mucosa; Nasal Swab  Result Value Ref Range Status   MRSA by PCR Next Gen NOT DETECTED NOT DETECTED Final    Comment: (NOTE) The GeneXpert MRSA Assay (FDA approved for NASAL specimens only), is one component of a comprehensive MRSA colonization  surveillance program. It is not intended to diagnose MRSA infection nor to guide or monitor treatment for MRSA infections. Test performance is not FDA approved in patients less than 1 years old. Performed at [facility], [address], [place], [place] [address]      Labs: BNP (last 3 results) No results for input(s): "BNP" in the last 8760 hours. Basic Metabolic Panel: Recent Labs  Lab 11/17/22 1830 11/18/22 0503 11/19/22 0444  NA 137 136 134*  K 3.1* 3.0* 3.3*  CL 98 101 101  CO2 27 25 23   GLUCOSE 121* 113* 108*  BUN 14 13 9   CREATININE 0.71 0.49 0.51  CALCIUM 8.8* 8.5* 8.2*  MG  --  1.7 1.6*  PHOS  --  3.1  --    Liver Function Tests: Recent Labs  Lab 11/17/22 1830 11/18/22 0503  AST 20 19  ALT 24 22  ALKPHOS 84 82  BILITOT 0.5 0.6  PROT 7.6 6.6  ALBUMIN 3.1* 2.8*   No results for input(s): "LIPASE", "AMYLASE" in the last 168 hours. No results for input(s): "AMMONIA" in the last 168 hours. CBC: Recent Labs  Lab 11/17/22 1830 11/18/22 0503 11/19/22 0444  WBC 15.1* 14.3* 11.2*  NEUTROABS 12.1*  --   --   HGB 12.7 11.5* 10.7*  HCT 37.9 35.2* 32.8*  MCV 85.4  85.2 84.3  PLT 407* 378 413*   Cardiac Enzymes: No results for input(s): "CKTOTAL", "CKMB", "CKMBINDEX", "TROPONINI" in the last 168 hours. BNP: Invalid input(s): "POCBNP" CBG: No results for input(s): "GLUCAP" in the last 168 hours. D-Dimer No results for input(s): "DDIMER" in the last 72 hours. Hgb A1c No results for input(s): "HGBA1C" in the last 72 hours. Lipid Profile No results for input(s): "CHOL", "HDL", "LDLCALC", "TRIG", "CHOLHDL", "LDLDIRECT" in the last 72 hours. Thyroid function studies No results for input(s): "TSH", "T4TOTAL", "T3FREE", "THYROIDAB" in the last 72 hours.  Invalid input(s): "FREET3" Anemia work up No results for input(s): "VITAMINB12", "FOLATE", "FERRITIN", "TIBC", "IRON", "RETICCTPCT" in the last 72 hours. Urinalysis    Component Value Date/Time    COLORURINE AMBER (A) 11/17/2022 2000   APPEARANCEUR CLOUDY (A) 11/17/2022 2000   LABSPEC >1.046 (H) 11/17/2022 2000   PHURINE 5.0 11/17/2022 2000   GLUCOSEU NEGATIVE 11/17/2022 2000   HGBUR NEGATIVE 11/17/2022 2000   BILIRUBINUR MODERATE (A) 11/17/2022 2000   KETONESUR NEGATIVE 11/17/2022 2000   PROTEINUR 100 (A) 11/17/2022 2000   NITRITE NEGATIVE 11/17/2022 2000   LEUKOCYTESUR NEGATIVE 11/17/2022 2000   Sepsis Labs Recent Labs  Lab 11/17/22 1830 11/18/22 0503 11/19/22 0444  WBC 15.1* 14.3* 11.2*   Microbiology Recent Results (from the past 240 hour(s))  Blood Culture (routine x 2)     Status: None (Preliminary result)   Collection Time: 11/17/22  6:28 PM   Specimen: Right Antecubital; Blood  Result Value Ref Range Status   Specimen Description RIGHT ANTECUBITAL  Final   Special Requests   Final    BOTTLES DRAWN AEROBIC AND ANAEROBIC Blood Culture adequate volume   Culture   Final    NO GROWTH 2 DAYS Performed at [facility], [address], [place], [place] [address]    Report Status PENDING  Incomplete  Blood Culture (routine x 2)     Status: None (Preliminary result)   Collection Time: 11/17/22  6:30 PM   Specimen: Left Antecubital; Blood  Result Value Ref Range Status   Specimen Description LEFT ANTECUBITAL  Final   Special Requests   Final    BOTTLES DRAWN AEROBIC AND ANAEROBIC Blood Culture adequate volume   Culture   Final    NO GROWTH 2 DAYS Performed at [facility], [address], [place], [place] [address]    Report Status PENDING  Incomplete  Resp panel by RT-PCR (RSV, Flu A&B, Covid) Anterior Nasal Swab     Status: None   Collection Time: 11/17/22  6:38 PM   Specimen: Anterior Nasal Swab  Result Value Ref Range Status   SARS Coronavirus 2 by RT PCR NEGATIVE NEGATIVE Final    Comment: (NOTE) SARS-CoV-2 target nucleic acids are NOT DETECTED.  The SARS-CoV-2 RNA is generally detectable in upper respiratory specimens during the acute phase of infection.  The lowest concentration of SARS-CoV-2 viral copies this assay can detect is 138 copies/mL. A negative result does not preclude SARS-Cov-2 infection and should not be used as the sole basis for treatment or other patient management decisions. A negative result may occur with  improper specimen collection/handling, submission of specimen other than nasopharyngeal swab, presence of viral mutation(s) within the areas targeted by this assay, and inadequate number of viral copies(<138 copies/mL). A negative result must be combined with clinical observations, patient history, and epidemiological information. The expected result is Negative.  Fact Sheet for Patients:  BloggerCourse.com  Fact Sheet for Healthcare Providers:  SeriousBroker.it  This test  is no t yet approved or cleared by the [place] and  has been authorized for detection and/or diagnosis of SARS-CoV-2 by FDA under an Emergency Use Authorization (EUA). This EUA will remain  in effect (meaning this test can be used) for the duration of the COVID-19 declaration under Section 564(b)(1) of the Act, 21 U.S.C.section 360bbb-3(b)(1), unless the authorization is terminated  or revoked sooner.       Influenza A by PCR NEGATIVE NEGATIVE Final   Influenza B by PCR NEGATIVE NEGATIVE Final    Comment: (NOTE) The Xpert Xpress SARS-CoV-2/FLU/RSV plus assay is intended as an aid in the diagnosis of influenza from Nasopharyngeal swab specimens and should not be used as a sole basis for treatment. Nasal washings and aspirates are unacceptable for Xpert Xpress SARS-CoV-2/FLU/RSV testing.  Fact Sheet for Patients: BloggerCourse.com  Fact Sheet for Healthcare Providers: SeriousBroker.it  This test is not yet approved or cleared by the [place] FDA and has been authorized for detection and/or diagnosis of SARS-CoV-2 by FDA  under an Emergency Use Authorization (EUA). This EUA will remain in effect (meaning this test can be used) for the duration of the COVID-19 declaration under Section 564(b)(1) of the Act, 21 U.S.C. section 360bbb-3(b)(1), unless the authorization is terminated or revoked.     Resp Syncytial Virus by PCR NEGATIVE NEGATIVE Final    Comment: (NOTE) Fact Sheet for Patients: BloggerCourse.com  Fact Sheet for Healthcare Providers: SeriousBroker.it  This test is not yet approved or cleared by the [place] FDA and has been authorized for detection and/or diagnosis of SARS-CoV-2 by FDA under an Emergency Use Authorization (EUA). This EUA will remain in effect (meaning this test can be used) for the duration of the COVID-19 declaration under Section 564(b)(1) of the Act, 21 U.S.C. section 360bbb-3(b)(1), unless the authorization is terminated or revoked.  Performed at [facility], [address], [place], [place] [address]   MRSA Next Gen by PCR, Nasal     Status: None   Collection Time: 11/18/22  8:45 AM   Specimen: Nasal Mucosa; Nasal Swab  Result Value Ref Range Status   MRSA by PCR Next Gen NOT DETECTED NOT DETECTED Final    Comment: (NOTE) The GeneXpert MRSA Assay (FDA approved for NASAL specimens only), is one component of a comprehensive MRSA colonization surveillance program. It is not intended to diagnose MRSA infection nor to guide or monitor treatment for MRSA infections. Test performance is not FDA approved in patients less than 35 years old. Performed at [facility], [address] [place], [place] [address]      Time coordinating discharge: 35 minutes  SIGNED:   Erick Blinks, DO [facility] 11/19/2022, 10:50 AM  If 7PM-7AM, please contact night-coverage [email or web address]

## 2022-11-22 LAB — CULTURE, BLOOD (ROUTINE X 2)
Culture: NO GROWTH
Culture: NO GROWTH
Special Requests: ADEQUATE
Special Requests: ADEQUATE

## 2023-04-03 DEATH — deceased
